# Patient Record
Sex: Female | Born: 1945 | Race: White | Hispanic: No | Marital: Single | Smoking: Never smoker
Health system: Southern US, Community
[De-identification: ages and names within clinical notes are randomized; demographics above are authoritative.]

## PROBLEM LIST (undated history)

## (undated) DIAGNOSIS — C50919 Malignant neoplasm of unspecified site of unspecified female breast: Secondary | ICD-10-CM

## (undated) DIAGNOSIS — I4891 Unspecified atrial fibrillation: Secondary | ICD-10-CM

## (undated) DIAGNOSIS — K6389 Other specified diseases of intestine: Secondary | ICD-10-CM

## (undated) DIAGNOSIS — I82409 Acute embolism and thrombosis of unspecified deep veins of unspecified lower extremity: Secondary | ICD-10-CM

## (undated) DIAGNOSIS — I10 Essential (primary) hypertension: Secondary | ICD-10-CM

## (undated) DIAGNOSIS — G8929 Other chronic pain: Secondary | ICD-10-CM

## (undated) DIAGNOSIS — E119 Type 2 diabetes mellitus without complications: Secondary | ICD-10-CM

## (undated) DIAGNOSIS — M1990 Unspecified osteoarthritis, unspecified site: Secondary | ICD-10-CM

## (undated) HISTORY — PX: LYMPHADENECTOMY: SHX5960

## (undated) HISTORY — PX: TONSILLECTOMY: SUR1361

## (undated) HISTORY — PX: BREAST LUMPECTOMY: SHX2

## (undated) HISTORY — PX: KIDNEY STONE SURGERY: SHX686

## (undated) HISTORY — PX: CHOLECYSTECTOMY: SHX55

---

## 2010-05-18 ENCOUNTER — Emergency Department (INDEPENDENT_AMBULATORY_CARE_PROVIDER_SITE_OTHER)
Admission: EM | Admit: 2010-05-18 | Discharge: 2010-05-18 | Payer: Self-pay | Source: Home / Self Care | Admitting: Emergency Medicine

## 2010-05-18 DIAGNOSIS — R109 Unspecified abdominal pain: Secondary | ICD-10-CM

## 2010-05-18 DIAGNOSIS — K7689 Other specified diseases of liver: Secondary | ICD-10-CM

## 2010-05-18 DIAGNOSIS — K5730 Diverticulosis of large intestine without perforation or abscess without bleeding: Secondary | ICD-10-CM

## 2010-05-18 LAB — DIFFERENTIAL
Basophils Absolute: 0 10*3/uL (ref 0.0–0.1)
Eosinophils Absolute: 0.1 10*3/uL (ref 0.0–0.7)
Eosinophils Relative: 2 % (ref 0–5)
Lymphocytes Relative: 24 % (ref 12–46)
Neutrophils Relative %: 68 % (ref 43–77)

## 2010-05-18 LAB — COMPREHENSIVE METABOLIC PANEL
ALT: 48 U/L — ABNORMAL HIGH (ref 0–35)
AST: 43 U/L — ABNORMAL HIGH (ref 0–37)
Albumin: 4 g/dL (ref 3.5–5.2)
Alkaline Phosphatase: 98 U/L (ref 39–117)
Potassium: 4.2 mEq/L (ref 3.5–5.1)
Sodium: 145 mEq/L (ref 135–145)
Total Protein: 7.7 g/dL (ref 6.0–8.3)

## 2010-05-18 LAB — CBC
Platelets: 275 10*3/uL (ref 150–400)
RBC: 4.25 MIL/uL (ref 3.87–5.11)
RDW: 12.5 % (ref 11.5–15.5)
WBC: 7.7 10*3/uL (ref 4.0–10.5)

## 2010-05-18 LAB — URINALYSIS, ROUTINE W REFLEX MICROSCOPIC
Bilirubin Urine: NEGATIVE
Hgb urine dipstick: NEGATIVE
Ketones, ur: NEGATIVE mg/dL
Urine Glucose, Fasting: NEGATIVE mg/dL
pH: 5.5 (ref 5.0–8.0)

## 2010-05-18 LAB — URINE MICROSCOPIC-ADD ON

## 2015-11-21 ENCOUNTER — Encounter (HOSPITAL_BASED_OUTPATIENT_CLINIC_OR_DEPARTMENT_OTHER): Payer: Self-pay | Admitting: *Deleted

## 2015-11-21 ENCOUNTER — Emergency Department (HOSPITAL_BASED_OUTPATIENT_CLINIC_OR_DEPARTMENT_OTHER)
Admission: EM | Admit: 2015-11-21 | Discharge: 2015-11-21 | Disposition: A | Discharge status: Home / Self Care | Payer: Medicare Other | Attending: Emergency Medicine | Admitting: Emergency Medicine

## 2015-11-21 DIAGNOSIS — I10 Essential (primary) hypertension: Secondary | ICD-10-CM | POA: Diagnosis not present

## 2015-11-21 DIAGNOSIS — I482 Chronic atrial fibrillation, unspecified: Secondary | ICD-10-CM

## 2015-11-21 DIAGNOSIS — E119 Type 2 diabetes mellitus without complications: Secondary | ICD-10-CM | POA: Insufficient documentation

## 2015-11-21 DIAGNOSIS — R109 Unspecified abdominal pain: Secondary | ICD-10-CM | POA: Insufficient documentation

## 2015-11-21 DIAGNOSIS — R112 Nausea with vomiting, unspecified: Secondary | ICD-10-CM | POA: Diagnosis present

## 2015-11-21 DIAGNOSIS — Z87891 Personal history of nicotine dependence: Secondary | ICD-10-CM | POA: Insufficient documentation

## 2015-11-21 HISTORY — DX: Malignant neoplasm of unspecified site of unspecified female breast: C50.919

## 2015-11-21 HISTORY — DX: Unspecified osteoarthritis, unspecified site: M19.90

## 2015-11-21 HISTORY — DX: Acute embolism and thrombosis of unspecified deep veins of unspecified lower extremity: I82.409

## 2015-11-21 HISTORY — DX: Essential (primary) hypertension: I10

## 2015-11-21 HISTORY — DX: Other chronic pain: G89.29

## 2015-11-21 HISTORY — DX: Unspecified atrial fibrillation: I48.91

## 2015-11-21 HISTORY — DX: Type 2 diabetes mellitus without complications: E11.9

## 2015-11-21 LAB — COMPREHENSIVE METABOLIC PANEL
ALBUMIN: 3.7 g/dL (ref 3.5–5.0)
ALK PHOS: 61 U/L (ref 38–126)
ALT: 18 U/L (ref 14–54)
AST: 15 U/L (ref 15–41)
Anion gap: 8 (ref 5–15)
BILIRUBIN TOTAL: 1.2 mg/dL (ref 0.3–1.2)
BUN: 16 mg/dL (ref 6–20)
CALCIUM: 9.2 mg/dL (ref 8.9–10.3)
CO2: 29 mmol/L (ref 22–32)
Chloride: 104 mmol/L (ref 101–111)
Creatinine, Ser: 0.97 mg/dL (ref 0.44–1.00)
GFR calc Af Amer: >60 mL/min (ref >60)
GFR calc non Af Amer: 58 mL/min — ABNORMAL LOW (ref >60)
GLUCOSE: 183 mg/dL — AB (ref 65–99)
Potassium: 3.9 mmol/L (ref 3.5–5.1)
Sodium: 141 mmol/L (ref 135–145)
TOTAL PROTEIN: 7.2 g/dL (ref 6.5–8.1)

## 2015-11-21 LAB — CBC WITH DIFFERENTIAL/PLATELET
BASOS ABS: 0 10*3/uL (ref 0.0–0.1)
BASOS PCT: 1 %
EOS PCT: 2 %
Eosinophils Absolute: 0.1 10*3/uL (ref 0.0–0.7)
HCT: 43 % (ref 36.0–46.0)
Hemoglobin: 14.3 g/dL (ref 12.0–15.0)
LYMPHS PCT: 18 %
Lymphs Abs: 1.2 10*3/uL (ref 0.7–4.0)
MCH: 30.7 pg (ref 26.0–34.0)
MCHC: 33.3 g/dL (ref 30.0–36.0)
MCV: 92.3 fL (ref 78.0–100.0)
MONO ABS: 0.4 10*3/uL (ref 0.1–1.0)
Monocytes Relative: 6 %
Neutro Abs: 4.7 10*3/uL (ref 1.7–7.7)
Neutrophils Relative %: 73 %
PLATELETS: 193 10*3/uL (ref 150–400)
RBC: 4.66 MIL/uL (ref 3.87–5.11)
RDW: 13.9 % (ref 11.5–15.5)
WBC: 6.4 10*3/uL (ref 4.0–10.5)

## 2015-11-21 LAB — PROTIME-INR
INR: 1.01
Prothrombin Time: 13.3 seconds (ref 11.4–15.2)

## 2015-11-21 MED ORDER — WARFARIN SODIUM 5 MG PO TABS: 5.0000 mg | ORAL_TABLET | Freq: Every day | ORAL | 0 refills | Status: DC

## 2015-11-21 NOTE — ED Triage Notes (Signed)
Pt c/o n/v x 2 hrs , recent injection  to left knee at pain management center earlier today

## 2015-11-21 NOTE — ED Provider Notes (Signed)
[place] DEPT [facility] Provider Note   CSN: [id number] Arrival date & time: 11/21/15  1925  First Provider Contact:   First MD Initiated Contact with Patient 11/21/15 2057    By signing my name below, I, Evelene Croon, attest that this documentation has been prepared under the direction and in the presence of Ripley Fraise, MD . Electronically Signed: Evelene Croon, Scribe. 11/21/2015. 9:13 PM.  History   Chief Complaint Chief Complaint  Patient presents with  . Emesis    The history is provided by the patient. No language interpreter was used.  Emesis   This is a new problem. The current episode started 3 to 5 hours ago. The problem occurs 2 to 4 times per day. The problem has been resolved. There has been no fever. Associated symptoms include abdominal pain. Pertinent negatives include no diarrhea and no fever.   HPI Comments:  Sheryl Aguilar is a 70 y.o. female who presents to the Emergency Department complaining of vomiting which began 1830 today, states she had 2 episodes. Pt  took 3 Percocet tabs ~1300; states she does not normally take that many at one time. Pt states she took the Percocet because she was worried about the pain she may feel after receiving injections in her left knee this afternoon at pain management clinic. Pt states she feels better at this time, however, notes associated abdominal soreness. She also notes mild abdominal pain prior to her first episode of vomiting. She denies fever, HA, CP, and SOB. Pt is currently supposed to be on coumadin but states she lost the bottle and hasn't had any in ~ 1 month.   [facility]-PCP   Past Medical History:  Diagnosis Date  . A-fib ([place])   . Arthritis   . Breast CA ([place])   . Chronic pain   . Diabetes mellitus without complication ([place])   . DVT (deep venous thrombosis) ([place])   . Hypertension     There are no active problems to display for this patient.   Past Surgical History:  Procedure Laterality Date  . BREAST  LUMPECTOMY    . LYMPHADENECTOMY    . TONSILLECTOMY      OB History    No data available       Home Medications    Prior to Admission medications   Not on File    Family History No family history on file.  Social History Social History  Substance Use Topics  . Smoking status: Former Research scientist (life sciences)  . Smokeless tobacco: Not on file  . Alcohol use No     Allergies   Other   Review of Systems Review of Systems  Constitutional: Negative for fever.  Gastrointestinal: Positive for abdominal pain and vomiting. Negative for diarrhea.  All other systems reviewed and are negative.  Physical Exam Updated Vital Signs BP 124/93 (BP Location: Right Arm)   Pulse 83   Temp 97.8 F (36.6 C) (Oral)   Resp 18   Ht 5\' 5"  (1.651 m)   Wt 235 lb (106.6 kg)   SpO2 96%   BMI 39.11 kg/m   Physical Exam CONSTITUTIONAL: Well developed/well nourished HEAD: Normocephalic/atraumatic EYES: EOMI/PERRL ENMT: Mucous membranes moist NECK: supple no meningeal signs SPINE/BACK:entire spine nontender CV: irregular rhythm; murmur noted  LUNGS: Lungs are clear to auscultation bilaterally, no apparent distress ABDOMEN: soft, nontender, no rebound or guarding, bowel sounds noted throughout abdomen, she is obese GU:no cva tenderness NEURO: Pt is awake/alert/appropriate, moves all extremitiesx4.  No facial droop.  EXTREMITIES: pulses normal/equal, full ROM; chronic edema noted,  erythema noted to right leg (chronic per patient) SKIN: warm, color normal PSYCH: no abnormalities of mood noted, alert and oriented to situation    ED Treatments / Results  DIAGNOSTIC STUDIES:  Oxygen Saturation is 96% on RA, normal by my interpretation.    COORDINATION OF CARE:  9:08 PM Discussed treatment plan with pt at bedside and pt agreed to plan.  Labs (all labs ordered are listed, but only abnormal results are displayed) Labs Reviewed  COMPREHENSIVE METABOLIC PANEL - Abnormal; Notable for the following:        Result Value   Glucose, Bld 183 (*)    GFR calc non Af Amer 58 (*)    All other components within normal limits  CBC WITH DIFFERENTIAL/PLATELET  PROTIME-INR    EKG  EKG Interpretation  Date/Time:  Wednesday November 21 2015 22:01:30 EDT Ventricular Rate:  83 PR Interval:    QRS Duration: 108 QT Interval:  447 QTC Calculation: 526 R Axis:   86 Text Interpretation:  Atrial fibrillation Borderline right axis deviation Borderline repolarization abnormality Prolonged QT interval Baseline wander in lead(s) II III aVL aVF No previous ECGs available Confirmed by Christy Gentles  MD, Elenore Rota ([id number]) on 11/21/2015 10:06:33 PM       Radiology No results found.  Procedures Procedures   Medications Ordered in ED Medications - No data to display   Initial Impression / Assessment and Plan / ED Course  I have reviewed the triage vital signs and the nursing notes.  Pertinent labs & imaging results that were available during my care of the patient were reviewed by me and considered in my medical decision making (see chart for details).  Clinical Course    This patients CHA2DS2-VASc Score and unadjusted Ischemic Stroke Rate (% per year) is equal to 7.2 % stroke rate/year from a score of 5  Above score calculated as 1 point each if present [CHF, HTN, DM, Vascular=MI/PAD/Aortic Plaque, Age if 65-74, or Female] Above score calculated as 2 points each if present [Age > 75, or Stroke/TIA/TE]    Pt improved Suspect this was related to overmedicating.  However, this does not represent toxic overdose She is well appearing/watching TV No abd pain complaints for now  As for her afib, she freely admits to not taking her coumadin INR subtherapeutic We agreed for me to write for an Rx for 10 days while she is awaiting recheck and expressed importance of have close f/u with coumadin clinic    Final Clinical Impressions(s) / ED Diagnoses   Final diagnoses:  Non-intractable vomiting with nausea,  vomiting of unspecified type  Chronic atrial fibrillation [facility])    New Prescriptions Discharge Medication List as of 11/21/2015 11:08 PM    START taking these medications   Details  warfarin (COUMADIN) 5 MG tablet Take 1 tablet (5 mg total) by mouth daily., Starting Wed 11/21/2015, Print       I personally performed the services described in this documentation, which was scribed in my presence. The recorded information has been reviewed and is accurate.        Ripley Fraise, MD 11/22/15 [phone]

## 2015-11-21 NOTE — ED Notes (Signed)
Dr. Wickline at BS.  

## 2015-11-21 NOTE — ED Notes (Signed)
Alert, NAD, calm, interactive, VSS, family at [facility], denies questions or needs, pending lab results.

## 2015-11-21 NOTE — ED Notes (Signed)
Pt here for recent nv. Denies pain or nv at this time. Reports recent L knee injection today at 1600. Later vomited x2 at [facility]. "feel better now". Also reports took 4 tablets of percocet 10/325 at 1300, "usually take 3, took 4 b/c I was expecting the knee injection to hurt a lot", OD on tylenol and not taking meds as prescribed discussed with pt, h/o CA with chemo, also reports can't find my coumadin, have not had it in days, last PT/INR months ago". Pt alert, NAD, calm, interactive, resps e/u, no dyspnea noted, denies complaints at this time. RLE minimally red and swollen compared to L, "usual for me". CMS/ROM intact.

## 2017-12-06 ENCOUNTER — Other Ambulatory Visit: Payer: Self-pay

## 2017-12-06 ENCOUNTER — Encounter (HOSPITAL_BASED_OUTPATIENT_CLINIC_OR_DEPARTMENT_OTHER): Payer: Self-pay | Admitting: Emergency Medicine

## 2017-12-06 DIAGNOSIS — Z853 Personal history of malignant neoplasm of breast: Secondary | ICD-10-CM | POA: Insufficient documentation

## 2017-12-06 DIAGNOSIS — Z7984 Long term (current) use of oral hypoglycemic drugs: Secondary | ICD-10-CM | POA: Insufficient documentation

## 2017-12-06 DIAGNOSIS — E119 Type 2 diabetes mellitus without complications: Secondary | ICD-10-CM | POA: Insufficient documentation

## 2017-12-06 DIAGNOSIS — L03115 Cellulitis of right lower limb: Secondary | ICD-10-CM | POA: Diagnosis not present

## 2017-12-06 DIAGNOSIS — I10 Essential (primary) hypertension: Secondary | ICD-10-CM | POA: Insufficient documentation

## 2017-12-06 DIAGNOSIS — Z7901 Long term (current) use of anticoagulants: Secondary | ICD-10-CM | POA: Diagnosis not present

## 2017-12-06 DIAGNOSIS — M79661 Pain in right lower leg: Secondary | ICD-10-CM | POA: Diagnosis present

## 2017-12-06 DIAGNOSIS — Z87891 Personal history of nicotine dependence: Secondary | ICD-10-CM | POA: Insufficient documentation

## 2017-12-06 NOTE — ED Triage Notes (Signed)
C/o R foot pain up to mid shin. Skin darkened in foot at baseline, but pt states it is darker than usual now. Pt states this has been going on x2 weeks. Pt states she cannot bear weight, denies injury.

## 2017-12-07 ENCOUNTER — Emergency Department (HOSPITAL_BASED_OUTPATIENT_CLINIC_OR_DEPARTMENT_OTHER): Payer: Medicare Other

## 2017-12-07 ENCOUNTER — Emergency Department (HOSPITAL_BASED_OUTPATIENT_CLINIC_OR_DEPARTMENT_OTHER)
Admission: EM | Admit: 2017-12-07 | Discharge: 2017-12-07 | Disposition: A | Discharge status: Home / Self Care | Payer: Medicare Other | Attending: Emergency Medicine | Admitting: Emergency Medicine

## 2017-12-07 DIAGNOSIS — L03115 Cellulitis of right lower limb: Secondary | ICD-10-CM

## 2017-12-07 LAB — CBC WITH DIFFERENTIAL/PLATELET
Basophils Absolute: 0 10*3/uL (ref 0.0–0.1)
Basophils Relative: 0 %
Eosinophils Absolute: 0.2 10*3/uL (ref 0.0–0.7)
Eosinophils Relative: 2 %
HEMATOCRIT: 39.7 % (ref 36.0–46.0)
HEMOGLOBIN: 13.6 g/dL (ref 12.0–15.0)
LYMPHS PCT: 19 %
Lymphs Abs: 1.9 10*3/uL (ref 0.7–4.0)
MCH: 32.2 pg (ref 26.0–34.0)
MCHC: 34.3 g/dL (ref 30.0–36.0)
MCV: 93.9 fL (ref 78.0–100.0)
MONOS PCT: 6 %
Monocytes Absolute: 0.6 10*3/uL (ref 0.1–1.0)
NEUTROS PCT: 73 %
Neutro Abs: 7.4 10*3/uL (ref 1.7–7.7)
Platelets: 210 10*3/uL (ref 150–400)
RBC: 4.23 MIL/uL (ref 3.87–5.11)
RDW: 13.3 % (ref 11.5–15.5)
WBC: 10 10*3/uL (ref 4.0–10.5)

## 2017-12-07 LAB — BASIC METABOLIC PANEL
Anion gap: 9 (ref 5–15)
BUN: 18 mg/dL (ref 8–23)
CHLORIDE: 99 mmol/L (ref 98–111)
CO2: 32 mmol/L (ref 22–32)
CREATININE: 1.09 mg/dL — AB (ref 0.44–1.00)
Calcium: 8.8 mg/dL — ABNORMAL LOW (ref 8.9–10.3)
GFR calc non Af Amer: 49 mL/min — ABNORMAL LOW (ref >60)
GFR, EST AFRICAN AMERICAN: 57 mL/min — AB (ref >60)
Glucose, Bld: 223 mg/dL — ABNORMAL HIGH (ref 70–99)
Potassium: 3.4 mmol/L — ABNORMAL LOW (ref 3.5–5.1)
Sodium: 140 mmol/L (ref 135–145)

## 2017-12-07 LAB — I-STAT CG4 LACTIC ACID, ED: Lactic Acid, Venous: 1.38 mmol/L (ref 0.5–1.9)

## 2017-12-07 LAB — TROPONIN I

## 2017-12-07 MED ORDER — DOXYCYCLINE HYCLATE 100 MG PO CAPS: 100.0000 mg | ORAL_CAPSULE | Freq: Two times a day (BID) | ORAL | 0 refills | Status: DC

## 2017-12-07 MED ORDER — HYDROCODONE-ACETAMINOPHEN 5-325 MG PO TABS
1.0000 | ORAL_TABLET | Freq: Once | ORAL | Status: AC
  Administered 2017-12-07: 1 via ORAL
  Filled 2017-12-07: qty 1

## 2017-12-07 MED ORDER — IOPAMIDOL (ISOVUE-370) INJECTION 76%
100.0000 mL | Freq: Once | INTRAVENOUS | Status: AC | PRN
  Administered 2017-12-07: 100 mL via INTRAVENOUS

## 2017-12-07 MED ORDER — DOXYCYCLINE HYCLATE 100 MG PO TABS
100.0000 mg | ORAL_TABLET | Freq: Once | ORAL | Status: AC
  Administered 2017-12-07: 100 mg via ORAL
  Filled 2017-12-07: qty 1

## 2017-12-07 NOTE — ED Provider Notes (Signed)
[place] EMERGENCY DEPARTMENT Provider Note   CSN: [id number] Arrival date & time: 12/06/17  2316     History   Chief Complaint Chief Complaint  Patient presents with  . Foot Pain    HPI Sheryl Aguilar is a 72 y.o. female.  72 year old female with past medical history including atrial fibrillation on Eliquis, breast cancer, DVT, hypertension, type 2 diabetes mellitus who presents with right lower leg pain.  The patient states that she has a long-standing history of right lower leg swelling and skin darkening.  She states her doctor has seen her for this before and has told her it is due to poor circulation.  She reports some neuropathy in her feet that is chronic but states that she does not usually have pain.  Over the past 2 weeks, she has had progressively worsening pain that started on the lateral side of her right foot, then involved her entire foot, and now involves her lower leg up to her mid shin.  She denies any trauma or fall.  Pain is more severe when trying to bear weight on her leg.  She denies any fevers or vomiting.  She is compliant with her medications.  She notes that she has had intermittent L sided non-radiating sharp chest pain occasionally for a long time. It occurs randomly, is not associated w/ exertion, and lasts seconds to a few minutes. She denies any CP currently. She has chronic SOB with exertion that has not changed recently. She denies any current SOB.  The history is provided by the patient.  Foot Pain     Past Medical History:  Diagnosis Date  . A-fib ([place])   . Arthritis   . Breast CA ([place])   . Chronic pain   . Diabetes mellitus without complication ([place])   . DVT (deep venous thrombosis) ([place])   . Hypertension     There are no active problems to display for this patient.   Past Surgical History:  Procedure Laterality Date  . BREAST LUMPECTOMY    . CHOLECYSTECTOMY    . LYMPHADENECTOMY    . TONSILLECTOMY       OB History     None      Home Medications    Prior to Admission medications   Medication Sig Start Date End Date Taking? Authorizing Provider  Apixaban (ELIQUIS PO) Take by mouth.   Yes [provider]  carvedilol (COREG CR) 10 MG 24 hr capsule Take 10 mg by mouth daily.   Yes [provider]  DULoxetine (CYMBALTA) 60 MG capsule Take 60 mg by mouth daily.   Yes [provider]  furosemide (LASIX) 20 MG tablet Take 10 mg by mouth daily.   Yes [provider]  metFORMIN (GLUCOPHAGE) 1000 MG tablet Take 1,000 mg by mouth 2 (two) times daily with a meal.   Yes [provider]  oxyCODONE-acetaminophen (PERCOCET) 7.5-325 MG tablet Take 1 tablet by mouth every 4 (four) hours as needed for severe pain.   Yes [provider]  doxycycline (VIBRAMYCIN) 100 MG capsule Take 1 capsule (100 mg total) by mouth 2 (two) times daily. 12/07/17   , Wenda Overland, MD    Family History No family history on file.  Social History Social History   Tobacco Use  . Smoking status: Former Research scientist (life sciences)  . Smokeless tobacco: Never Used  Substance Use Topics  . Alcohol use: No  . Drug use: No     Allergies   Other  Review of Systems Review of Systems All other systems reviewed and are negative except that which was mentioned in HPI   Physical Exam Updated Vital Signs BP 132/69   Pulse 86   Temp 98.4 F (36.9 C) (Oral)   Resp (!) 26   Ht 5\' 5"  (1.651 m)   Wt (!) 137.9 kg   SpO2 95%   BMI 50.59 kg/m   Physical Exam  Constitutional: She is oriented to person, place, and time. She appears well-developed and well-nourished. No distress.  HENT:  Head: Normocephalic and atraumatic.  Moist mucous membranes  Eyes: Conjunctivae are normal.  Neck: Neck supple.  Cardiovascular: Normal rate, regular rhythm and normal heart sounds.  No murmur heard. Pulmonary/Chest: Effort normal and breath sounds normal.  Abdominal: Soft. Bowel sounds are normal. She  exhibits no distension. There is no tenderness.  Musculoskeletal: She exhibits edema.  R leg significantly larger than left with skin darkening of foot, erythema of anterior lower leg w/ warmth compared to L; small callus on plantar surface R great toe, no drainage  Neurological: She is alert and oriented to person, place, and time.  Fluent speech  Skin: Skin is warm and dry. There is erythema.  Psychiatric: She has a normal mood and affect. Judgment normal.  Nursing note and vitals reviewed.    ED Treatments / Results  Labs (all labs ordered are listed, but only abnormal results are displayed) Labs Reviewed  BASIC METABOLIC PANEL - Abnormal; Notable for the following components:      Result Value   Potassium 3.4 (*)    Glucose, Bld 223 (*)    Creatinine, Ser 1.09 (*)    Calcium 8.8 (*)    GFR calc non Af Amer 49 (*)    GFR calc Af Amer 57 (*)    All other components within normal limits  CBC WITH DIFFERENTIAL/PLATELET  TROPONIN I  I-STAT CG4 LACTIC ACID, ED  I-STAT CG4 LACTIC ACID, ED    EKG EKG Interpretation  Date/Time:  Monday December 07 2017 03:34:56 EDT Ventricular Rate:  86 PR Interval:    QRS Duration: 92 QT Interval:  465 QTC Calculation: 557 R Axis:   81 Text Interpretation:  Atrial fibrillation Borderline right axis deviation Borderline repolarization abnormality Prolonged QT interval similar to previous Confirmed by Theotis Burrow [phone]) on 12/07/2017 3:59:15 AM   Radiology Dg Tibia/fibula Right  Result Date: 12/07/2017 CLINICAL DATA:  Right leg pain with swelling to the foot EXAM: RIGHT TIBIA AND FIBULA - 2 VIEW COMPARISON:  08/19/2016 FINDINGS: No fracture or malalignment. Marked arthritis medial and patellofemoral compartments. Soft tissue edema without emphysema. No periostitis or bony destructive changes. IMPRESSION: No acute osseous abnormality Electronically Signed   By: Donavan Foil M.D.   On: 12/07/2017 03:27   Ct Angio Low Extrem Right W &/or Wo  Contrast  Result Date: 12/07/2017 CLINICAL DATA:  Right foot pain up to the mid shin. Darkened skin of the foot. Symptoms for 2 weeks. Unable to bear weight. Previous history of deep venous thrombosis, diabetes, hypertension. EXAM: CT ANGIOGRAPHY OF THE right lowerEXTREMITY TECHNIQUE: Multidetector CT imaging of the right lowerwas performed using the standard protocol during bolus administration of intravenous contrast. Multiplanar CT image reconstructions and MIPs were obtained to evaluate the vascular anatomy. CONTRAST:  186mL ISOVUE-370 IOPAMIDOL (ISOVUE-370) INJECTION 76% COMPARISON:  None. FINDINGS: Vascular: The right external iliac artery, right common femoral artery, right superficial and deep femoral arteries, popliteal artery, and tibial trunk appear patent. Three-vessel runoff  is demonstrated to the right ankle. No significant focal stenosis or occlusion. Soft tissues: Diverticulosis of the sigmoid colon without inflammatory changes. Bladder wall is not thickened. No acute infiltrative changes or mass demonstrated in the visualized musculature. Mild edema in the subcutaneous fatty tissues of the lower leg. Small popliteal cyst. Bones: Degenerative changes in the right hip and right knee. No destructive or expansile bone lesions. No acute displaced fractures are demonstrated. Review of the MIP images confirms the above findings. IMPRESSION: No evidence of significant vascular stenosis or occlusion in the right lower extremity. Three vessel runoff to the right ankle. Electronically Signed   By: Lucienne Capers M.D.   On: 12/07/2017 05:08   Dg Foot Complete Right  Result Date: 12/07/2017 CLINICAL DATA:  Foot swelling and pain EXAM: RIGHT FOOT COMPLETE - 3+ VIEW COMPARISON:  None. FINDINGS: No fracture or malalignment. Small plantar calcaneal spur. Mild degenerative change at the first MTP joint. IMPRESSION: No acute osseous abnormality. Electronically Signed   By: Donavan Foil M.D.   On: 12/07/2017  03:28    Procedures Procedures (including critical care time)  Medications Ordered in ED Medications  doxycycline (VIBRA-TABS) tablet 100 mg (has no administration in time range)  HYDROcodone-acetaminophen (NORCO/VICODIN) 5-325 MG per tablet 1 tablet (1 tablet Oral Given 12/07/17 0356)  iopamidol (ISOVUE-370) 76 % injection 100 mL (100 mLs Intravenous Contrast Given 12/07/17 0356)  HYDROcodone-acetaminophen (NORCO/VICODIN) 5-325 MG per tablet 1 tablet (1 tablet Oral Given 12/07/17 0356)     Initial Impression / Assessment and Plan / ED Course  I have reviewed the triage vital signs and the nursing notes.  Pertinent labs & imaging results that were available during my care of the patient were reviewed by me and considered in my medical decision making (see chart for details).    Pt uncomfortable but non-toxic on exam, afebrile w/ reassuring VS. R leg significantly more swollen than L which she states is chronic, however today it is also warm and erythematous compared to L. Lactate and CBC normal, BG 223 with no evidence of DKA. Trop normal and EKG without ischemic changes. The pain that she describes over L breast is random, has been intermittently present for a long time, and is very atypical for ACS. PE very unlikely as patient is compliant with Eliquis.  XR of lower leg and foot negative. Obtained CTA to evaluate for PVD. CTA shows patent vessels and no acute musculoskeletal findings. Although patient states her current skin changes and swelling are chronic, the warmth and redness suggests cellulitis. Given her pain, recommended antibiotic course. Gave doxycycline here w/ Rx and instructions to f/u with PCP in a few days for reassessment. Discussed supportive measures including elevation and compression stockings.  Extensively reviewed return precautions including any worsening symptoms or new symptoms such as fever.  Also reviewed the importance of checking feet daily given her known diabetes  and diabetic neuropathy.  She and family voiced understanding. Final Clinical Impressions(s) / ED Diagnoses   Final diagnoses:  Cellulitis of right lower extremity    ED Discharge Orders         Ordered    doxycycline (VIBRAMYCIN) 100 MG capsule  2 times daily     12/07/17 0536           , Wenda Overland, MD 12/07/17 [phone]

## 2017-12-07 NOTE — Discharge Instructions (Signed)
Start taking antibiotic for cellulitis (skin infection) of your leg. Keep your leg elevated with pillows whenever you are sitting or laying down. Wear compression stockings. Check your feet daily and monitor for any foot wounds that look infected. See primary care doctor later this week to make sure your infection is getting better. Return to ER if your symptoms worsen or your start having fever or drainage.

## 2017-12-07 NOTE — ED Notes (Signed)
No changes, to CT, alert, NAD, calm, repositioned, family at [facility]. Pain med given.

## 2017-12-07 NOTE — ED Notes (Signed)
Pt not in room, in xray.

## 2017-12-07 NOTE — ED Notes (Signed)
EDP back at BS.  

## 2017-12-07 NOTE — ED Notes (Signed)
EDP into room, at [facility]. Pt/family updated with results and plan.

## 2017-12-07 NOTE — ED Notes (Signed)
Dr. Rex Kras into room, at [facility]. Pt remains alert, NAD, calm, interactive, resps e/u, no dyspnea.

## 2017-12-07 NOTE — ED Notes (Addendum)
Alert, NAD, calm, interactive, resps e/u, speaking in clear complete sentences, no dyspnea noted, skin W&D, VSS, reports h/o arthritis, c/o related L knee pain, also RLE heat, swelling and redness, (denies: CP, new sob, fever, nausea, dizziness or visual changes), BIB family. BLE dry, flaky, scaly.

## 2019-02-03 ENCOUNTER — Other Ambulatory Visit: Payer: Self-pay

## 2019-02-03 ENCOUNTER — Emergency Department (HOSPITAL_BASED_OUTPATIENT_CLINIC_OR_DEPARTMENT_OTHER): Payer: Medicare Other

## 2019-02-03 ENCOUNTER — Encounter (HOSPITAL_BASED_OUTPATIENT_CLINIC_OR_DEPARTMENT_OTHER): Payer: Self-pay | Admitting: *Deleted

## 2019-02-03 ENCOUNTER — Emergency Department (HOSPITAL_BASED_OUTPATIENT_CLINIC_OR_DEPARTMENT_OTHER)
Admission: EM | Admit: 2019-02-03 | Discharge: 2019-02-04 | Disposition: A | Discharge status: Home / Self Care | Payer: Medicare Other | Attending: Emergency Medicine | Admitting: Emergency Medicine

## 2019-02-03 DIAGNOSIS — S52125A Nondisplaced fracture of head of left radius, initial encounter for closed fracture: Secondary | ICD-10-CM

## 2019-02-03 DIAGNOSIS — Z79899 Other long term (current) drug therapy: Secondary | ICD-10-CM | POA: Diagnosis not present

## 2019-02-03 DIAGNOSIS — I10 Essential (primary) hypertension: Secondary | ICD-10-CM | POA: Diagnosis not present

## 2019-02-03 DIAGNOSIS — Z87891 Personal history of nicotine dependence: Secondary | ICD-10-CM | POA: Diagnosis not present

## 2019-02-03 DIAGNOSIS — Z7901 Long term (current) use of anticoagulants: Secondary | ICD-10-CM | POA: Diagnosis not present

## 2019-02-03 DIAGNOSIS — Y9389 Activity, other specified: Secondary | ICD-10-CM | POA: Diagnosis not present

## 2019-02-03 DIAGNOSIS — Y9289 Other specified places as the place of occurrence of the external cause: Secondary | ICD-10-CM | POA: Diagnosis not present

## 2019-02-03 DIAGNOSIS — W19XXXA Unspecified fall, initial encounter: Secondary | ICD-10-CM | POA: Diagnosis not present

## 2019-02-03 DIAGNOSIS — Y999 Unspecified external cause status: Secondary | ICD-10-CM | POA: Insufficient documentation

## 2019-02-03 DIAGNOSIS — M5489 Other dorsalgia: Secondary | ICD-10-CM | POA: Diagnosis present

## 2019-02-03 DIAGNOSIS — M546 Pain in thoracic spine: Secondary | ICD-10-CM | POA: Insufficient documentation

## 2019-02-03 DIAGNOSIS — E119 Type 2 diabetes mellitus without complications: Secondary | ICD-10-CM | POA: Diagnosis not present

## 2019-02-03 NOTE — ED Provider Notes (Signed)
[facility] EMERGENCY DEPARTMENT Provider Note   CSN: [id number] Arrival date & time: 02/03/19  1843     History   Chief Complaint Chief Complaint  Patient presents with  . Back Pain    HPI Sheryl Aguilar is a 73 y.o. female.     Patient presented to the emergency department by POV.  Patient woke with pain in the right side of her neck shoulder and back.  Pain with movement.  Patient also with left elbow pain following a fall with swelling.  Patient afebrile on presentation room air sats 95%.  Patient apparently has had frequent falls here.  She is here with her daughter.  Patient last seen in the emergency department in August 2019.  Past medical history significant for hypertension breast cancer diabetes without complications history of chronic pain A. fib and deep vein thrombosis.  Patient is on Eliquis.     Past Medical History:  Diagnosis Date  . A-fib ([place])   . Arthritis   . Breast CA ([place])   . Chronic pain   . Diabetes mellitus without complication ([place])   . DVT (deep venous thrombosis) ([place])   . Hypertension     There are no active problems to display for this patient.   Past Surgical History:  Procedure Laterality Date  . BREAST LUMPECTOMY    . CHOLECYSTECTOMY    . LYMPHADENECTOMY    . TONSILLECTOMY       OB History   No obstetric history on file.      Home Medications    Prior to Admission medications   Medication Sig Start Date End Date Taking? Authorizing Provider  Apixaban (ELIQUIS PO) Take by mouth.    [provider]  carvedilol (COREG CR) 10 MG 24 hr capsule Take 10 mg by mouth daily.    [provider]  doxycycline (VIBRAMYCIN) 100 MG capsule Take 1 capsule (100 mg total) by mouth 2 (two) times daily. 12/07/17   Little, Wenda Overland, MD  DULoxetine (CYMBALTA) 60 MG capsule Take 60 mg by mouth daily.    [provider]  furosemide (LASIX) 20 MG tablet Take 10 mg by mouth daily.    [provider]   metFORMIN (GLUCOPHAGE) 1000 MG tablet Take 1,000 mg by mouth 2 (two) times daily with a meal.    [provider]  oxyCODONE-acetaminophen (PERCOCET) 7.5-325 MG tablet Take 1 tablet by mouth every 4 (four) hours as needed for severe pain.    [provider]    Family History No family history on file.  Social History Social History   Tobacco Use  . Smoking status: Former Research scientist (life sciences)  . Smokeless tobacco: Never Used  Substance Use Topics  . Alcohol use: No  . Drug use: No     Allergies   Other   Review of Systems Review of Systems  Constitutional: Negative for chills and fever.  HENT: Negative for congestion, rhinorrhea and sore throat.   Eyes: Negative for visual disturbance.  Respiratory: Negative for cough and shortness of breath.   Cardiovascular: Negative for chest pain and leg swelling.  Gastrointestinal: Negative for abdominal pain, diarrhea, nausea and vomiting.  Genitourinary: Negative for dysuria.  Musculoskeletal: Positive for back pain, joint swelling and neck pain.  Skin: Negative for rash.  Neurological: Negative for dizziness, syncope, speech difficulty, weakness, light-headedness, numbness and headaches.  Hematological: Does not bruise/bleed easily.  Psychiatric/Behavioral: Negative for confusion.     Physical Exam Updated Vital Signs BP 113/66 (BP  Location: Right Arm)   Pulse 94   Temp 98.5 F (36.9 C) (Oral)   Resp 17   Ht 1.626 m (5\' 4" )   Wt (!) 138.3 kg   SpO2 98%   BMI 52.35 kg/m   Physical Exam Vitals signs and nursing note reviewed.  Constitutional:      General: She is not in acute distress.    Appearance: Normal appearance. She is well-developed.  HENT:     Head: Normocephalic and atraumatic.  Eyes:     Extraocular Movements: Extraocular movements intact.     Conjunctiva/sclera: Conjunctivae normal.     Pupils: Pupils are equal, round, and reactive to light.  Neck:     Musculoskeletal: Normal range of motion and  neck supple. No neck rigidity or muscular tenderness.  Cardiovascular:     Rate and Rhythm: Normal rate and regular rhythm.     Heart sounds: No murmur.  Pulmonary:     Effort: Pulmonary effort is normal. No respiratory distress.     Breath sounds: Normal breath sounds.     Comments: Some tenderness along the lateral aspect of the right chest wall.  And towards the thoracic spine area. Chest:     Chest wall: Tenderness present.  Abdominal:     Palpations: Abdomen is soft.     Tenderness: There is no abdominal tenderness.  Musculoskeletal: Normal range of motion.     Comments: Left elbow with area of swelling around it.  Discomfort with range of motion.  Right upper extremity without any tenderness but discomfort with range of motion.  Radial pulses bilaterally 1-2+.  Good cap refill.  Sensation distally intact.  Skin:    General: Skin is warm and dry.     Capillary Refill: Capillary refill takes less than 2 seconds.  Neurological:     General: No focal deficit present.     Mental Status: She is alert and oriented to person, place, and time.     Cranial Nerves: No cranial nerve deficit.     Sensory: No sensory deficit.     Motor: No weakness.      ED Treatments / Results  Labs (all labs ordered are listed, but only abnormal results are displayed) Labs Reviewed - No data to display  EKG None  Radiology Dg Ribs Unilateral W/chest Right  Result Date: 02/03/2019 CLINICAL DATA:  Fall EXAM: RIGHT RIBS AND CHEST - 3+ VIEW COMPARISON:  03/09/2017 FINDINGS: Single-view chest demonstrates no acute airspace disease or effusion. Heart size upper normal. No pneumothorax. Right rib series demonstrates no acute displaced right rib fracture. Clips in the right upper quadrant. Coils in the epigastric region. IMPRESSION: Negative. Electronically Signed   By: Donavan Foil M.D.   On: 02/03/2019 21:52   Dg Shoulder Right  Result Date: 02/03/2019 CLINICAL DATA:  Multiple fall with right  shoulder pain EXAM: RIGHT SHOULDER - 2+ VIEW COMPARISON:  None. FINDINGS: Moderate AC joint degenerative change. No fracture or malalignment. Right lung apex is clear IMPRESSION: No acute osseous abnormality Electronically Signed   By: Donavan Foil M.D.   On: 02/03/2019 21:50   Dg Elbow Complete Left  Result Date: 02/03/2019 CLINICAL DATA:  Question a radial head fracture EXAM: LEFT ELBOW - COMPLETE 3+ VIEW COMPARISON:  Same day humeral radiograph FINDINGS: Mildly impacted fracture of the radial neck with a large elbow joint effusion. Radial head remains normally located. Question a small lucency along the capitellum which could reflect either a nondisplaced fracture or bony contusive  injury. Mild degenerative spurring is noted at the coronoid process. Mild circumferential swelling is present. IMPRESSION: 1. Mildly impacted fracture of the radial neck with a large elbow joint effusion. 2. Question a small lucency along the capitellum which could reflect either a nondisplaced fracture or bony contusive injury. Electronically Signed   By: Lovena Le M.D.   On: 02/03/2019 23:11   Dg Humerus Left  Result Date: 02/03/2019 CLINICAL DATA:  73 year old female status post fall with pain. EXAM: LEFT HUMERUS - 2+ VIEW COMPARISON:  None. FINDINGS: Bone mineralization is within normal limits. Grossly normal alignment at the left shoulder. Proximal left humerus appears intact. Left axillary surgical clips incidentally noted. Visible left ribs appear intact. Intact left humeral shaft, and the distal humerus appears intact but images which include the elbow suggest a comminuted acute fracture of the left proximal radius, at the junction of the head and neck. IMPRESSION: 1. Suspected acute fracture of the left proximal radius at the elbow. Recommend dedicated Left Elbow Series. 2. Left humerus appears intact. Electronically Signed   By: Genevie Ann M.D.   On: 02/03/2019 21:49    Procedures Procedures (including critical  care time)  Medications Ordered in ED Medications - No data to display   Initial Impression / Assessment and Plan / ED Course  I have reviewed the triage vital signs and the nursing notes.  Pertinent labs & imaging results that were available during my care of the patient were reviewed by me and considered in my medical decision making (see chart for details).       Patient's x-ray chest with right sided ribs without any acute findings.  X-ray of the right upper extremity without any acute findings.  X-ray of the left elbow showed a suspected a acute fracture left proximal radius at the elbow.  Dedicated x-rays of the right elbow showed a mildly impacted right radial head fracture.  Patient placed in a sling.  And referred to Ortho's and/or sports medicine.  No other acute findings.   Final Clinical Impressions(s) / ED Diagnoses   Final diagnoses:  Acute right-sided thoracic back pain  Closed nondisplaced fracture of head of left radius, initial encounter    ED Discharge Orders    None       Fredia Sorrow, MD 02/15/19 [phone]

## 2019-02-03 NOTE — ED Triage Notes (Signed)
She woke with pain in the right side of her neck, shoulder and back. Pain with movement.

## 2019-02-03 NOTE — ED Notes (Signed)
Escorted pt to treatment room via wheelchair, instructed to put on gown. Awaiting provider.

## 2019-02-03 NOTE — Discharge Instructions (Addendum)
Continue take your Percocet.  Make an appointment to follow-up with your orthopedic doctor.  Wear the sling at all times except for bathing.

## 2020-06-19 HISTORY — PX: OTHER SURGICAL HISTORY: SHX169

## 2020-08-28 ENCOUNTER — Encounter: Payer: Self-pay | Admitting: Endocrinology

## 2021-01-22 ENCOUNTER — Observation Stay (HOSPITAL_COMMUNITY)
Admission: EM | Admit: 2021-01-22 | Discharge: 2021-01-23 | Disposition: A | Discharge status: Home / Self Care | Payer: Medicare Other | Attending: Internal Medicine | Admitting: Internal Medicine

## 2021-01-22 ENCOUNTER — Emergency Department (HOSPITAL_COMMUNITY): Payer: Medicare Other

## 2021-01-22 ENCOUNTER — Other Ambulatory Visit: Payer: Self-pay

## 2021-01-22 DIAGNOSIS — N179 Acute kidney failure, unspecified: Secondary | ICD-10-CM | POA: Diagnosis not present

## 2021-01-22 DIAGNOSIS — I4891 Unspecified atrial fibrillation: Secondary | ICD-10-CM | POA: Diagnosis not present

## 2021-01-22 DIAGNOSIS — R509 Fever, unspecified: Secondary | ICD-10-CM | POA: Diagnosis present

## 2021-01-22 DIAGNOSIS — N390 Urinary tract infection, site not specified: Secondary | ICD-10-CM | POA: Diagnosis present

## 2021-01-22 DIAGNOSIS — R519 Headache, unspecified: Secondary | ICD-10-CM | POA: Diagnosis not present

## 2021-01-22 DIAGNOSIS — N3000 Acute cystitis without hematuria: Secondary | ICD-10-CM | POA: Diagnosis not present

## 2021-01-22 DIAGNOSIS — M25569 Pain in unspecified knee: Secondary | ICD-10-CM

## 2021-01-22 DIAGNOSIS — E119 Type 2 diabetes mellitus without complications: Secondary | ICD-10-CM | POA: Insufficient documentation

## 2021-01-22 DIAGNOSIS — U071 COVID-19: Secondary | ICD-10-CM | POA: Diagnosis not present

## 2021-01-22 DIAGNOSIS — J9611 Chronic respiratory failure with hypoxia: Secondary | ICD-10-CM | POA: Diagnosis present

## 2021-01-22 DIAGNOSIS — I10 Essential (primary) hypertension: Secondary | ICD-10-CM | POA: Insufficient documentation

## 2021-01-22 DIAGNOSIS — W19XXXA Unspecified fall, initial encounter: Secondary | ICD-10-CM | POA: Insufficient documentation

## 2021-01-22 DIAGNOSIS — G8929 Other chronic pain: Secondary | ICD-10-CM | POA: Diagnosis present

## 2021-01-22 DIAGNOSIS — Z853 Personal history of malignant neoplasm of breast: Secondary | ICD-10-CM | POA: Diagnosis not present

## 2021-01-22 HISTORY — DX: Other specified diseases of intestine: K63.89

## 2021-01-22 MED ORDER — SODIUM CHLORIDE 0.9 % IV SOLN
2.0000 g | INTRAVENOUS | Status: DC
  Administered 2021-01-23: 2 g via INTRAVENOUS
  Filled 2021-01-22: qty 20

## 2021-01-22 MED ORDER — LACTATED RINGERS IV BOLUS
1000.0000 mL | Freq: Once | INTRAVENOUS | Status: AC
  Administered 2021-01-23: 1000 mL via INTRAVENOUS

## 2021-01-22 MED ORDER — FENTANYL CITRATE PF 50 MCG/ML IJ SOSY
25.0000 ug | PREFILLED_SYRINGE | Freq: Once | INTRAMUSCULAR | Status: AC
Start: 2021-01-23 — End: 2021-01-23
  Administered 2021-01-23: 25 ug via INTRAVENOUS
  Filled 2021-01-22: qty 1

## 2021-01-22 MED ORDER — ACETAMINOPHEN 500 MG PO TABS
1000.0000 mg | ORAL_TABLET | Freq: Once | ORAL | Status: AC
  Administered 2021-01-23: 1000 mg via ORAL
  Filled 2021-01-22: qty 2

## 2021-01-22 NOTE — ED Provider Notes (Signed)
[facility] Emergency Department Provider Note MRN:  [id number]  Arrival date & time: 01/23/21     Chief Complaint   Fall   History of Present Illness   Sheryl Aguilar is a 75 y.o. year-old female with a history of A. fib, chronic pain, diabetes presenting to the ED with chief complaint of fall.  Patient was standing up from the commode and lost her balance and fell.  She explains that she has a long history of arthritis and peripheral neuropathy in her legs and that caused her to stumble.  She fell onto her bottom and is endorsing some pain in that location.  She is also endorsing chronic pain to bilateral knees, unchanged from the fall.  Unsure if she hit her head.  Takes blood thinners.  No neck or back pain, no chest pain or shortness of breath, no abdominal pain.  No nausea vomiting or diarrhea recently.  No rash.  Has been feeling a chill over the past day or 2.  Febrile on arrival.  Pain is moderate to severe, constant, worse with motion or palpation.  Review of Systems  A complete 10 system review of systems was obtained and all systems are negative except as noted in the HPI and PMH.   Patient's Health History    Past Medical History:  Diagnosis Date   A-fib ([place])    Arthritis    Breast CA ([place])    Chronic pain    Diabetes mellitus without complication ([place])    DVT (deep venous thrombosis) ([place])    Hypertension     Past Surgical History:  Procedure Laterality Date   BREAST LUMPECTOMY     CHOLECYSTECTOMY     LYMPHADENECTOMY     TONSILLECTOMY      No family history on file.  Social History   Socioeconomic History   Marital status: Single    Spouse name: Not on file   Number of children: Not on file   Years of education: Not on file   Highest education level: Not on file  Occupational History   Not on file  Tobacco Use   Smoking status: Former   Smokeless tobacco: Never  Vaping Use   Vaping Use: Never used  Substance and Sexual  Activity   Alcohol use: No   Drug use: No   Sexual activity: Not on file  Other Topics Concern   Not on file  Social History Narrative   Not on file   Social Determinants of Health   Financial Resource Strain: Not on file  Food Insecurity: Not on file  Transportation Needs: Not on file  Physical Activity: Not on file  Stress: Not on file  Social Connections: Not on file  Intimate Partner Violence: Not on file     Physical Exam   Vitals:   01/23/21 0113 01/23/21 0130  BP: (!) 143/80 (!) 148/84  Pulse: (!) 102 (!) 106  Resp: (!) 24 (!) 24  Temp: 100.3 F (37.9 C)   SpO2: 98% 96%    CONSTITUTIONAL: Well-appearing, NAD NEURO:  Alert and oriented x 3, no focal deficits EYES:  eyes equal and reactive ENT/NECK:  no LAD, no JVD CARDIO: Regular rate, well-perfused, normal S1 and S2 PULM:  CTAB no wheezing or rhonchi GI/GU:  normal bowel sounds, non-distended, non-tender MSK/SPINE:  No gross deformities, no edema SKIN:  no rash, atraumatic PSYCH:  Appropriate speech and behavior  *Additional and/or pertinent findings included in MDM below  Diagnostic and Interventional  Summary    EKG Interpretation  Date/Time:  Wednesday January 23 2021 00:02:59 EDT Ventricular Rate:  103 PR Interval:    QRS Duration: 95 QT Interval:  362 QTC Calculation: 474 R Axis:   109 Text Interpretation: Atrial fibrillation Right axis deviation Low voltage, precordial leads Minimal ST depression, anterolateral leads Confirmed by Gerlene Fee [phone]) on 01/23/2021 12:17:15 AM       Labs Reviewed  RESP PANEL BY RT-PCR (FLU A&B, COVID) ARPGX2 - Abnormal; Notable for the following components:      Result Value   SARS Coronavirus 2 by RT PCR POSITIVE (*)    All other components within normal limits  CBC - Abnormal; Notable for the following components:   WBC 12.0 (*)    All other components within normal limits  COMPREHENSIVE METABOLIC PANEL - Abnormal; Notable for the following components:    Sodium 134 (*)    Glucose, Bld 335 (*)    BUN 26 (*)    Creatinine, Ser 1.47 (*)    Calcium 8.6 (*)    Albumin 3.2 (*)    AST 14 (*)    GFR, Estimated 37 (*)    All other components within normal limits  URINALYSIS, ROUTINE W REFLEX MICROSCOPIC - Abnormal; Notable for the following components:   APPearance CLOUDY (*)    Glucose, UA >=500 (*)    Hgb urine dipstick MODERATE (*)    Protein, ur 100 (*)    Nitrite POSITIVE (*)    Leukocytes,Ua LARGE (*)    WBC, UA >50 (*)    Bacteria, UA MANY (*)    All other components within normal limits  PROTIME-INR - Abnormal; Notable for the following components:   Prothrombin Time 17.1 (*)    INR 1.4 (*)    All other components within normal limits  APTT - Abnormal; Notable for the following components:   aPTT 42 (*)    All other components within normal limits  CULTURE, BLOOD (SINGLE)  LACTIC ACID, PLASMA    CT HEAD WO CONTRAST (5MM)  Final Result    DG Hips Bilat W or Wo Pelvis 2 Views  Final Result    DG Lumbar Spine Complete  Final Result    DG Chest 1 View  Final Result      Medications  cefTRIAXone (ROCEPHIN) 2 g in sodium chloride 0.9 % 100 mL IVPB (0 g Intravenous Stopped 01/23/21 0109)  fentaNYL (SUBLIMAZE) injection 25 mcg (25 mcg Intravenous Given 01/23/21 0006)  lactated ringers bolus 1,000 mL (1,000 mLs Intravenous New Bag/Given 01/23/21 0108)  acetaminophen (TYLENOL) tablet 1,000 mg (1,000 mg Oral Given 01/23/21 0009)  HYDROmorphone (DILAUDID) injection 1 mg (1 mg Intravenous Given 01/23/21 0109)     Procedures  /  Critical Care .Critical Care Performed by: Maudie Flakes, MD Authorized by: Maudie Flakes, MD   Critical care provider statement:    Critical care time (minutes):  45   Critical care was necessary to treat or prevent imminent or life-threatening deterioration of the following conditions:  Sepsis   Critical care was time spent personally by me on the following activities:  Discussions with  consultants, evaluation of patient's response to treatment, examination of patient, ordering and performing treatments and interventions, ordering and review of laboratory studies, ordering and review of radiographic studies, pulse oximetry, re-evaluation of patient's condition, obtaining history from patient or surrogate and review of old charts  ED Course and Medical Decision Making  I have reviewed the triage vital  signs, the nursing notes, and pertinent available records from the EMR.  Listed above are laboratory and imaging tests that I personally ordered, reviewed, and interpreted and then considered in my medical decision making (see below for details).  Febrile, tachycardic, tachypneic, enough SIRS criteria to justify code sepsis initiation.  Unclear febrile source at this time.  Awaiting labs, chest x-ray, urinalysis.     Work-up reveals likely UTI, mild AKI, patient is also positive for COVID-19.  Will admit to medicine.  [name]. [name], MD [place] [email or web address]  Final Clinical Impressions(s) / ED Diagnoses     ICD-10-CM   1. COVID-19  U07.1     2. Fall  W19.[facility]err[facility] Abbe DG Chest 1 View    DG Chest 1 View    3. Fever  R50.9 DG Chest 1 View    DG Chest 1 View    4. AKI (acute kidney injury) ([place])  N17.9     5. Acute cystitis without hematuria  N30.00     6. Fall, initial encounter  W19.[facility]       ED Discharge Orders     None        Discharge Instructions Discussed with and Provided to Patient:   Discharge Instructions   None       Maudie Flakes, MD 01/23/21 [phone]

## 2021-01-22 NOTE — ED Triage Notes (Addendum)
Pt came from home via EMS. C/c: fall with bilateral knee pain, pt has hx of chronic knee pain, but pain is worse today. No head injury, no LOC, pt is on eliquis. Pt afebrile with EMS, but febrile at ED. Pt states "I felt freezing cold at home for the past couple of days but I never checked my temperature." Pt states "I dont really remember what happened when I fell, but I know I fell directly on my bottom." 148/101 102 94% 10/10 pain in bilateral knees, 3/10 pain in low back.

## 2021-01-23 ENCOUNTER — Emergency Department (HOSPITAL_COMMUNITY): Payer: Medicare Other

## 2021-01-23 ENCOUNTER — Other Ambulatory Visit (HOSPITAL_COMMUNITY): Payer: Medicare Other

## 2021-01-23 ENCOUNTER — Observation Stay (HOSPITAL_COMMUNITY): Payer: Medicare Other

## 2021-01-23 ENCOUNTER — Encounter (HOSPITAL_COMMUNITY): Payer: Self-pay | Admitting: Internal Medicine

## 2021-01-23 DIAGNOSIS — N179 Acute kidney failure, unspecified: Secondary | ICD-10-CM | POA: Diagnosis present

## 2021-01-23 DIAGNOSIS — G8929 Other chronic pain: Secondary | ICD-10-CM | POA: Diagnosis present

## 2021-01-23 DIAGNOSIS — I4891 Unspecified atrial fibrillation: Secondary | ICD-10-CM | POA: Diagnosis present

## 2021-01-23 DIAGNOSIS — J9611 Chronic respiratory failure with hypoxia: Secondary | ICD-10-CM | POA: Diagnosis present

## 2021-01-23 DIAGNOSIS — U071 COVID-19: Secondary | ICD-10-CM | POA: Diagnosis present

## 2021-01-23 DIAGNOSIS — N390 Urinary tract infection, site not specified: Secondary | ICD-10-CM | POA: Diagnosis present

## 2021-01-23 DIAGNOSIS — R509 Fever, unspecified: Secondary | ICD-10-CM

## 2021-01-23 DIAGNOSIS — N3000 Acute cystitis without hematuria: Secondary | ICD-10-CM | POA: Diagnosis not present

## 2021-01-23 LAB — RESP PANEL BY RT-PCR (FLU A&B, COVID) ARPGX2
Influenza A by PCR: NEGATIVE
Influenza B by PCR: NEGATIVE
SARS Coronavirus 2 by RT PCR: POSITIVE — AB

## 2021-01-23 LAB — COMPREHENSIVE METABOLIC PANEL
ALT: 11 U/L (ref 0–44)
ALT: 12 U/L (ref 0–44)
AST: 14 U/L — ABNORMAL LOW (ref 15–41)
AST: 13 U/L — ABNORMAL LOW (ref 15–41)
Albumin: 3.2 g/dL — ABNORMAL LOW (ref 3.5–5.0)
Albumin: 3 g/dL — ABNORMAL LOW (ref 3.5–5.0)
Alkaline Phosphatase: 59 U/L (ref 38–126)
Alkaline Phosphatase: 54 U/L (ref 38–126)
Anion gap: 9 (ref 5–15)
Anion gap: 10 (ref 5–15)
BUN: 26 mg/dL — ABNORMAL HIGH (ref 8–23)
BUN: 22 mg/dL (ref 8–23)
CO2: 26 mmol/L (ref 22–32)
CO2: 23 mmol/L (ref 22–32)
Calcium: 8.6 mg/dL — ABNORMAL LOW (ref 8.9–10.3)
Calcium: 8.5 mg/dL — ABNORMAL LOW (ref 8.9–10.3)
Chloride: 99 mmol/L (ref 98–111)
Chloride: 103 mmol/L (ref 98–111)
Creatinine, Ser: 1.47 mg/dL — ABNORMAL HIGH (ref 0.44–1.00)
Creatinine, Ser: 1.03 mg/dL — ABNORMAL HIGH (ref 0.44–1.00)
GFR, Estimated: 37 mL/min — ABNORMAL LOW (ref >60)
GFR, Estimated: 57 mL/min — ABNORMAL LOW (ref >60)
Glucose, Bld: 335 mg/dL — ABNORMAL HIGH (ref 70–99)
Glucose, Bld: 282 mg/dL — ABNORMAL HIGH (ref 70–99)
Potassium: 4.6 mmol/L (ref 3.5–5.1)
Potassium: 4.3 mmol/L (ref 3.5–5.1)
Sodium: 134 mmol/L — ABNORMAL LOW (ref 135–145)
Sodium: 136 mmol/L (ref 135–145)
Total Bilirubin: 0.9 mg/dL (ref 0.3–1.2)
Total Bilirubin: 1.1 mg/dL (ref 0.3–1.2)
Total Protein: 7.9 g/dL (ref 6.5–8.1)
Total Protein: 7.5 g/dL (ref 6.5–8.1)

## 2021-01-23 LAB — CBC WITH DIFFERENTIAL/PLATELET
Abs Immature Granulocytes: 0.07 10*3/uL (ref 0.00–0.07)
Basophils Absolute: 0 10*3/uL (ref 0.0–0.1)
Basophils Relative: 0 %
Eosinophils Absolute: 0 10*3/uL (ref 0.0–0.5)
Eosinophils Relative: 0 %
HCT: 36.6 % (ref 36.0–46.0)
Hemoglobin: 12 g/dL (ref 12.0–15.0)
Immature Granulocytes: 1 %
Lymphocytes Relative: 6 %
Lymphs Abs: 0.6 10*3/uL — ABNORMAL LOW (ref 0.7–4.0)
MCH: 31.3 pg (ref 26.0–34.0)
MCHC: 32.8 g/dL (ref 30.0–36.0)
MCV: 95.3 fL (ref 80.0–100.0)
Monocytes Absolute: 0.7 10*3/uL (ref 0.1–1.0)
Monocytes Relative: 7 %
Neutro Abs: 8.5 10*3/uL — ABNORMAL HIGH (ref 1.7–7.7)
Neutrophils Relative %: 86 %
Platelets: 151 10*3/uL (ref 150–400)
RBC: 3.84 MIL/uL — ABNORMAL LOW (ref 3.87–5.11)
RDW: 14.8 % (ref 11.5–15.5)
WBC: 9.9 10*3/uL (ref 4.0–10.5)
nRBC: 0 % (ref 0.0–0.2)

## 2021-01-23 LAB — HEMOGLOBIN A1C
Hgb A1c MFr Bld: 8.4 % — ABNORMAL HIGH (ref 4.8–5.6)
Mean Plasma Glucose: 194.38 mg/dL

## 2021-01-23 LAB — URINALYSIS, ROUTINE W REFLEX MICROSCOPIC
Bilirubin Urine: NEGATIVE
Glucose, UA: >=500 mg/dL — AB
Ketones, ur: NEGATIVE mg/dL
Nitrite: POSITIVE — AB
Protein, ur: 100 mg/dL — AB
Specific Gravity, Urine: 1.016 (ref 1.005–1.030)
WBC, UA: >50 WBC/hpf — ABNORMAL HIGH (ref 0–5)
pH: 5 (ref 5.0–8.0)

## 2021-01-23 LAB — C-REACTIVE PROTEIN: CRP: 18.6 mg/dL — ABNORMAL HIGH (ref <1.0)

## 2021-01-23 LAB — CBC
HCT: 39 % (ref 36.0–46.0)
Hemoglobin: 12.7 g/dL (ref 12.0–15.0)
MCH: 31.1 pg (ref 26.0–34.0)
MCHC: 32.6 g/dL (ref 30.0–36.0)
MCV: 95.4 fL (ref 80.0–100.0)
Platelets: 187 10*3/uL (ref 150–400)
RBC: 4.09 MIL/uL (ref 3.87–5.11)
RDW: 14.6 % (ref 11.5–15.5)
WBC: 12 10*3/uL — ABNORMAL HIGH (ref 4.0–10.5)
nRBC: 0 % (ref 0.0–0.2)

## 2021-01-23 LAB — CBG MONITORING, ED
Glucose-Capillary: 275 mg/dL — ABNORMAL HIGH (ref 70–99)
Glucose-Capillary: 354 mg/dL — ABNORMAL HIGH (ref 70–99)
Glucose-Capillary: 213 mg/dL — ABNORMAL HIGH (ref 70–99)
Glucose-Capillary: 214 mg/dL — ABNORMAL HIGH (ref 70–99)

## 2021-01-23 LAB — D-DIMER, QUANTITATIVE: D-Dimer, Quant: 1.61 ug/mL-FEU — ABNORMAL HIGH (ref 0.00–0.50)

## 2021-01-23 LAB — PROCALCITONIN: Procalcitonin: 0.4 ng/mL

## 2021-01-23 LAB — LACTIC ACID, PLASMA: Lactic Acid, Venous: 1 mmol/L (ref 0.5–1.9)

## 2021-01-23 LAB — APTT: aPTT: 42 seconds — ABNORMAL HIGH (ref 24–36)

## 2021-01-23 LAB — PROTIME-INR
INR: 1.4 — ABNORMAL HIGH (ref 0.8–1.2)
Prothrombin Time: 17.1 seconds — ABNORMAL HIGH (ref 11.4–15.2)

## 2021-01-23 MED ORDER — ONDANSETRON HCL 4 MG/2ML IJ SOLN: 4.0000 mg | Freq: Four times a day (QID) | INTRAMUSCULAR | Status: DC | PRN

## 2021-01-23 MED ORDER — GABAPENTIN 300 MG PO CAPS
300.0000 mg | ORAL_CAPSULE | Freq: Two times a day (BID) | ORAL | Status: DC
  Administered 2021-01-23: 300 mg via ORAL
  Filled 2021-01-23: qty 1

## 2021-01-23 MED ORDER — SODIUM CHLORIDE 0.9 % IV SOLN: 200.0000 mg | Freq: Once | INTRAVENOUS | Status: DC

## 2021-01-23 MED ORDER — ONDANSETRON HCL 4 MG PO TABS: 4.0000 mg | ORAL_TABLET | Freq: Four times a day (QID) | ORAL | Status: DC | PRN

## 2021-01-23 MED ORDER — SODIUM CHLORIDE 0.9 % IV SOLN
100.0000 mg | Freq: Once | INTRAVENOUS | Status: AC
  Administered 2021-01-23: 100 mg via INTRAVENOUS
  Filled 2021-01-23: qty 20

## 2021-01-23 MED ORDER — INSULIN ASPART 100 UNIT/ML IJ SOLN
0.0000 [IU] | Freq: Three times a day (TID) | INTRAMUSCULAR | Status: DC
  Administered 2021-01-23: 8 [IU] via SUBCUTANEOUS
  Administered 2021-01-23 (×2): 5 [IU] via SUBCUTANEOUS
  Filled 2021-01-23: qty 0.15

## 2021-01-23 MED ORDER — HYDROMORPHONE HCL 1 MG/ML IJ SOLN
1.0000 mg | Freq: Once | INTRAMUSCULAR | Status: AC
Start: 2021-01-23 — End: 2021-01-23
  Administered 2021-01-23: 1 mg via INTRAVENOUS
  Filled 2021-01-23: qty 1

## 2021-01-23 MED ORDER — APIXABAN 5 MG PO TABS
5.0000 mg | ORAL_TABLET | Freq: Two times a day (BID) | ORAL | Status: DC
  Administered 2021-01-23: 5 mg via ORAL
  Filled 2021-01-23 (×2): qty 1

## 2021-01-23 MED ORDER — DULOXETINE HCL 30 MG PO CPEP
60.0000 mg | ORAL_CAPSULE | Freq: Every day | ORAL | Status: DC
  Administered 2021-01-23: 60 mg via ORAL
  Filled 2021-01-23: qty 2

## 2021-01-23 MED ORDER — CEPHALEXIN 250 MG PO CAPS: 250.0000 mg | ORAL_CAPSULE | Freq: Four times a day (QID) | ORAL | 0 refills | Status: AC

## 2021-01-23 MED ORDER — ACETAMINOPHEN 325 MG PO TABS: 650.0000 mg | ORAL_TABLET | Freq: Four times a day (QID) | ORAL | Status: DC | PRN

## 2021-01-23 MED ORDER — CARVEDILOL 12.5 MG PO TABS
12.5000 mg | ORAL_TABLET | Freq: Two times a day (BID) | ORAL | Status: DC
  Administered 2021-01-23: 12.5 mg via ORAL
  Filled 2021-01-23: qty 1

## 2021-01-23 MED ORDER — SODIUM CHLORIDE 0.9 % IV SOLN: 100.0000 mg | Freq: Every day | INTRAVENOUS | Status: DC

## 2021-01-23 MED ORDER — OXYCODONE-ACETAMINOPHEN 7.5-325 MG PO TABS
1.0000 | ORAL_TABLET | ORAL | Status: DC | PRN
  Administered 2021-01-23 (×3): 1 via ORAL
  Filled 2021-01-23 (×3): qty 1

## 2021-01-23 MED ORDER — REMDESIVIR 100 MG IV SOLR
100.0000 mg | Freq: Every day | INTRAVENOUS | Status: DC
Start: 2021-01-24 — End: 2021-01-23

## 2021-01-23 NOTE — H&P (Addendum)
History and Physical    [name] JTT:[id number] DOB: 1945-11-19 DOA: 01/22/2021  PCP: Kristopher Glee., MD  Patient coming from: Home  I have personally briefly reviewed patient's old medical records in [place]  Chief Complaint: Fall  HPI: Sheryl Aguilar is a 75 y.o. female with medical history significant of A.Fib on eliquis, chronic pain, HTN, DM2, chronic 3L o2 requirement.Marland Kitchen  Pt presents to the ED after a fall at home.  Patient was standing up from the commode and lost her balance and fell.  No LOC.  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  Initially having pain in bottom.  Has had chills over past day or 2.  No SOB more than baseline, no cough, no CP.   ED Course: COVID+ and has UTI and mild AKI.  At time of my eval, primary complaint is B knee pain.  This is baseline for her she states, percocet she takes at home normally works for pain.  Knee pain not new or different today.   Review of Systems: As per HPI, otherwise all review of systems negative.  Past Medical History:  Diagnosis Date   A-fib ([place])    Arthritis    Breast CA (HCC)    Chronic pain    Diabetes mellitus without complication ([place])    DVT (deep venous thrombosis) ([place])    Hypertension     Past Surgical History:  Procedure Laterality Date   BREAST LUMPECTOMY     CHOLECYSTECTOMY     LYMPHADENECTOMY     TONSILLECTOMY       reports that she has quit smoking. She has never used smokeless tobacco. She reports that she does not drink alcohol and does not use drugs.  Allergies  Allergen Reactions   Filgrastim Swelling and Shortness Of Breath    Hypotension.    Sargramostim Shortness Of Breath    Family History  Problem Relation Age of Onset   Stroke Mother    Hypertension Mother    Diabetes Mother    Cancer Mother    Heart disease Father    COPD Father    Cancer Paternal Grandfather      Prior to Admission medications   Medication Sig  Start Date End Date Taking? Authorizing Provider  apixaban (ELIQUIS) 5 MG TABS tablet Take 5 mg by mouth 2 (two) times daily.   Yes [provider]  carvedilol (COREG) 12.5 MG tablet Take 12.5 mg by mouth 2 (two) times daily.   Yes [provider]  cholecalciferol (VITAMIN D) 25 MCG (1000 UNIT) tablet Take 1,000 Units by mouth daily.   Yes [provider]  DULoxetine (CYMBALTA) 60 MG capsule Take 60 mg by mouth daily.   Yes [provider]  furosemide (LASIX) 20 MG tablet Take 20 mg by mouth 2 (two) times daily.   Yes [provider]  metFORMIN (GLUCOPHAGE) 1000 MG tablet Take 1,000 mg by mouth 2 (two) times daily with a meal.   Yes [provider]  oxyCODONE-acetaminophen (PERCOCET) 7.5-325 MG tablet Take 1 tablet by mouth every 4 (four) hours as needed for severe pain.   Yes [provider]  Semaglutide,0.25 or 0.5MG /DOS, 2 MG/1.5ML SOPN Inject 0.5 mg into the skin See admin instructions. Inject 17mL into the skin once weekly every Friday 05/01/20  Yes [provider]  gabapentin (NEURONTIN) 300 MG capsule Take 300 mg by mouth 2 (two) times daily. May take 1 additional capsule at  bedtime as needed for sleep    [provider]    Physical Exam: Vitals:   01/23/21 0200 01/23/21 0255 01/23/21 0300 01/23/21 0414  BP: (!) 154/89 (!) 150/83 (!) 148/78 (!) 172/99  Pulse: 98 98 98 92  Resp: (!) 27 17 19  (!) 24  Temp:      TempSrc:      SpO2: 95% 100% 97% 98%  Weight:      Height:        Constitutional: Tearful, uncomfortable Eyes: PERRL, lids and conjunctivae normal ENMT: Mucous membranes are moist. Posterior pharynx clear of any exudate or lesions.Normal dentition.  Neck: normal, supple, no masses, no thyromegaly Respiratory: clear to auscultation bilaterally, no wheezing, no crackles. Normal respiratory effort. No accessory muscle use.  Cardiovascular: Regular rate and rhythm, no murmurs / rubs / gallops. No  extremity edema. 2+ pedal pulses. No carotid bruits.  Abdomen: no tenderness, no masses palpated. No hepatosplenomegaly. Bowel sounds positive.  Musculoskeletal: no clubbing / cyanosis. No joint deformity upper and lower extremities. Good ROM, no contractures. Normal muscle tone.  Skin: no rashes, lesions, ulcers. No induration Neurologic: CN 2-12 grossly intact. Sensation intact, DTR normal. Strength 5/5 in all 4.  Psychiatric: Normal judgment and insight. Alert and oriented x 3. Normal mood.    Labs on Admission: I have personally reviewed following labs and imaging studies  CBC: Recent Labs  Lab 01/23/21 0000  WBC 12.0*  HGB 12.7  HCT 39.0  MCV 95.4  PLT 294   Basic Metabolic Panel: Recent Labs  Lab 01/23/21 0000  NA 134*  K 4.6  CL 99  CO2 26  GLUCOSE 335*  BUN 26*  CREATININE 1.47*  CALCIUM 8.6*   GFR: Estimated Creatinine Clearance: 46.7 mL/min (A) (by C-G formula based on SCr of 1.47 mg/dL (H)). Liver Function Tests: Recent Labs  Lab 01/23/21 0000  AST 14*  ALT 11  ALKPHOS 59  BILITOT 0.9  PROT 7.9  ALBUMIN 3.2*   No results for input(s): LIPASE, AMYLASE in the last 168 hours. No results for input(s): AMMONIA in the last 168 hours. Coagulation Profile: Recent Labs  Lab 01/23/21 0000  INR 1.4*   Cardiac Enzymes: No results for input(s): CKTOTAL, CKMB, CKMBINDEX, TROPONINI in the last 168 hours. BNP (last 3 results) No results for input(s): PROBNP in the last 8760 hours. HbA1C: No results for input(s): HGBA1C in the last 72 hours. CBG: No results for input(s): GLUCAP in the last 168 hours. Lipid Profile: No results for input(s): CHOL, HDL, LDLCALC, TRIG, CHOLHDL, LDLDIRECT in the last 72 hours. Thyroid Function Tests: No results for input(s): TSH, T4TOTAL, FREET4, T3FREE, THYROIDAB in the last 72 hours. Anemia Panel: No results for input(s): VITAMINB12, FOLATE, FERRITIN, TIBC, IRON, RETICCTPCT in the last 72 hours. Urine analysis:     Component Value Date/Time   COLORURINE YELLOW 01/23/2021 0030   APPEARANCEUR CLOUDY (A) 01/23/2021 0030   LABSPEC 1.016 01/23/2021 0030   PHURINE 5.0 01/23/2021 0030   GLUCOSEU >=500 (A) 01/23/2021 0030   HGBUR MODERATE (A) 01/23/2021 0030   BILIRUBINUR NEGATIVE 01/23/2021 0030   KETONESUR NEGATIVE 01/23/2021 0030   PROTEINUR 100 (A) 01/23/2021 0030   UROBILINOGEN 1.0 05/18/2010 1430   NITRITE POSITIVE (A) 01/23/2021 0030   LEUKOCYTESUR LARGE (A) 01/23/2021 0030    Radiological Exams on Admission: DG Chest 1 View  Result Date: 01/23/2021 CLINICAL DATA:  Fevers, recent fall EXAM: PORTABLE CHEST 1 VIEW COMPARISON:  06/19/2020 FINDINGS: Cardiac shadow is mildly prominent but  stable. The lungs are well aerated bilaterally. No focal infiltrate or effusion is seen. No bony abnormality is noted. Postsurgical changes in the left axilla are seen. IMPRESSION: No active disease. Electronically Signed   By: Inez Catalina M.D.   On: 01/23/2021 01:11   DG Lumbar Spine Complete  Result Date: 01/23/2021 CLINICAL DATA:  Recent fall with low back pain, initial encounter EXAM: LUMBAR SPINE - COMPLETE 4+ VIEW COMPARISON:  None. FINDINGS: Five lumbar type vertebral bodies are well visualized. Vertebral body height is well maintained. No pars defects are noted. No anterolisthesis is seen. Mild osteophytic changes are noted. Facet hypertrophic changes are seen. IMPRESSION: Degenerative change without acute abnormality. Electronically Signed   By: Inez Catalina M.D.   On: 01/23/2021 01:09   CT HEAD WO CONTRAST (5MM)  Result Date: 01/23/2021 CLINICAL DATA:  Recent fall on blood thinners, initial encounter EXAM: CT HEAD WITHOUT CONTRAST TECHNIQUE: Contiguous axial images were obtained from the base of the skull through the vertex without intravenous contrast. COMPARISON:  06/19/2020 FINDINGS: Brain: No evidence of acute infarction, hemorrhage, hydrocephalus, extra-axial collection or mass lesion/mass effect.  Vascular: No hyperdense vessel or unexpected calcification. Skull: Normal. Negative for fracture or focal lesion. Sinuses/Orbits: No acute finding. Other: None. IMPRESSION: No acute intracranial abnormality noted. Electronically Signed   By: Inez Catalina M.D.   On: 01/23/2021 01:17   DG Hips Bilat W or Wo Pelvis 2 Views  Result Date: 01/23/2021 CLINICAL DATA:  Recent fall with hip pain, initial encounter EXAM: DG HIP (WITH OR WITHOUT PELVIS) 2V BILAT COMPARISON:  None. FINDINGS: Pelvic ring is intact. Degenerative changes of lumbar spine and hip joints are seen. No acute fracture or dislocation is noted. No soft tissue abnormality is seen. IMPRESSION: Degenerative changes of the hip joints without acute bony abnormality. Electronically Signed   By: Inez Catalina M.D.   On: 01/23/2021 01:09    EKG: Independently reviewed.  Assessment/Plan Principal Problem:   COVID-19 virus infection Active Problems:   UTI (urinary tract infection)   AKI (acute kidney injury) (HCC)   Chronic respiratory failure with hypoxia (HCC)   A-fib (HCC)   Chronic pain    COVID-19 virus infection - Currently satting mid 90s on her baseline 3L home O2 Did have fever initially in ED, unclear if this was from [place] or from UTI. Will offer treatment for COVID with remdesivir not paxlovid candidate due to being on eliquis Covid pathway Cont pulse ox Daily labs UTI - Empiric rocephin Culture ordered and pending AKI - Likely due to COVID / UTI 1L LR bolus in ED Holding lasix Strict intake and output Trend creat HTN - Cont coreg A.Fib - Tele monitor Cont Coreg Cont Eliquis Chronic pain - Cont neurontin Cont PRN percocet for knee pain Will get X rays of B knees since it doesn't look like this has been done in a while, and given pt build, I would be completely un-surprised to find out that she needs a knee replacement or some such. X rays just came back: bone on bone severe arthritis May want to follow up  with ortho Though pt tells me she has been told previously that she weighs too much for TKA. Chronic resp failure with hypoxia - Satting well on her baseline 3L o2 No evidence of worsening at this point CXR neg despite COVID diagnosis.  DVT prophylaxis: Eliquis Code Status: Full Family Communication: No family in room Disposition Plan: Home after admit Consults called: None Admission status: Place in  obs     Rylin Seavey, [facility]  How to contact the [facility] Attending or Consulting provider [place] or covering provider during after hours [place], for this patient?  Check the care team in [facility] and look for a) attending/consulting [facility] provider listed and b) the [facility] team listed Log into [email or web address]  Amion Physician Scheduling and messaging for groups and whole hospitals  On call and physician scheduling software for group practices, residents, hospitalists and other medical providers for call, clinic, rotation and shift schedules. On[employer] is a hospital-wide system for scheduling doctors and paging doctors on call. EasyPlot is for scientific plotting and data analysis.  [email or web address]  and use [facility]'s universal password to access. If you do not have the password, please contact the hospital operator.  Locate the [facility] provider you are looking for under [facility] and page to a number that you can be directly reached. If you still have difficulty reaching the provider, please page the [facility] (Director on Call) for the Hospitalists listed on amion for assistance.  01/23/2021, 6:14 AM

## 2021-01-23 NOTE — ED Notes (Signed)
This RN called [facility] to check on status of transport. [facility] representative verbalized that transport trucks are still working on rides that were called at 1300

## 2021-01-23 NOTE — ED Notes (Signed)
Bed placement called and made aware patient is getting discharged. Waiting for [facility].

## 2021-01-23 NOTE — ED Notes (Signed)
Pt given meal tray.

## 2021-01-23 NOTE — Discharge Summary (Signed)
Physician Discharge Summary  Sheryl Aguilar GQQ:[id number] DOB: [birth year] DOA: 01/22/2021  PCP: Kristopher Glee., MD  Admit date: 01/22/2021 Discharge date: 01/23/2021  Admitted From: Home Disposition:  Home  Recommendations for Outpatient Follow-up:  Follow up with PCP in 1-2 weeks Please obtain BMP/CBC in one week Please follow up with urology as scheduled  Discharge Condition:Stable  CODE STATUS:Full  Diet recommendation: Diabetic diet    Brief/Interim Summary: Sheryl Aguilar is a 75 y.o. female with medical history significant of A.Fib on eliquis, chronic pain, HTN, DM2, chronic 3L o2 requirement. Pt presents to the ED after a fall at home. Patient was standing up from the commode and lost her balance and fell. No LOC. 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 Initially having pain in bottom after the fall. Has had chills/urinary urgency over past day or 2.  Patient incidentally noted to be COVID-positive, not felt to be overtly symptomatic given negative chest x-ray on baseline oxygen without any overt hypoxia from baseline with no complaints of shortness of breath nausea vomiting or other associated symptoms of COVID.  Review discussion with daughter over the phone about patient's diagnosis prognosis and discharge plans.  Patient otherwise stable for discharge back to previous living facility with close follow-up with PCP in the next 1 to 2 weeks as scheduled.  We discussed that should she have worsening respiratory symptoms or fevers chills nausea vomiting diarrhea or poor p.o. intake she should report back to the ED as these may be signs of acute COVID infection and would warrant treatment otherwise given no hypoxia x-ray findings or symptoms we will discontinue Remdesivir and otherwise discharge back home.  No medication changes other than antibiotics with cephalexin as below.  Discharge Instructions  Discharge Instructions     Call MD for:  extreme  fatigue   Complete by: As directed    Call MD for:  temperature >100.4   Complete by: As directed    Diet - low sodium heart healthy   Complete by: As directed    Increase activity slowly   Complete by: As directed       Allergies as of 01/23/2021       Reactions   Filgrastim Swelling, Shortness Of Breath   Hypotension.   Sargramostim Shortness Of Breath        Medication List     TAKE these medications    apixaban 5 MG Tabs tablet Commonly known as: ELIQUIS Take 5 mg by mouth 2 (two) times daily.   carvedilol 12.5 MG tablet Commonly known as: COREG Take 12.5 mg by mouth 2 (two) times daily.   cephALEXin 250 MG capsule Commonly known as: KEFLEX Take 1 capsule (250 mg total) by mouth 4 (four) times daily for 4 days.   cholecalciferol 25 MCG (1000 UNIT) tablet Commonly known as: VITAMIN D Take 1,000 Units by mouth daily.   DULoxetine 60 MG capsule Commonly known as: CYMBALTA Take 60 mg by mouth daily.   furosemide 20 MG tablet Commonly known as: LASIX Take 20 mg by mouth 2 (two) times daily.   gabapentin 300 MG capsule Commonly known as: NEURONTIN Take 300 mg by mouth 2 (two) times daily. May take 1 additional capsule at bedtime as needed for sleep   metFORMIN 1000 MG tablet Commonly known as: GLUCOPHAGE Take 1,000 mg by mouth 2 (two) times daily with a meal.   oxyCODONE-acetaminophen 7.5-325 MG tablet Commonly known as: PERCOCET Take 1 tablet by mouth  every 4 (four) hours as needed for severe pain.   Semaglutide(0.25 or 0.5MG /DOS) 2 MG/1.5ML Sopn Inject 0.5 mg into the skin See admin instructions. Inject 26mL into the skin once weekly every Friday        Allergies  Allergen Reactions   Filgrastim Swelling and Shortness Of Breath    Hypotension.    Sargramostim Shortness Of Breath    Procedures/Studies: DG Chest 1 View  Result Date: 01/23/2021 CLINICAL DATA:  Fevers, recent fall EXAM: PORTABLE CHEST 1 VIEW COMPARISON:  06/19/2020 FINDINGS:  Cardiac shadow is mildly prominent but stable. The lungs are well aerated bilaterally. No focal infiltrate or effusion is seen. No bony abnormality is noted. Postsurgical changes in the left axilla are seen. IMPRESSION: No active disease. Electronically Signed   By: Inez Catalina M.D.   On: 01/23/2021 01:11   DG Lumbar Spine Complete  Result Date: 01/23/2021 CLINICAL DATA:  Recent fall with low back pain, initial encounter EXAM: LUMBAR SPINE - COMPLETE 4+ VIEW COMPARISON:  None. FINDINGS: Five lumbar type vertebral bodies are well visualized. Vertebral body height is well maintained. No pars defects are noted. No anterolisthesis is seen. Mild osteophytic changes are noted. Facet hypertrophic changes are seen. IMPRESSION: Degenerative change without acute abnormality. Electronically Signed   By: Inez Catalina M.D.   On: 01/23/2021 01:09   DG Knee 1-2 Views Left  Result Date: 01/23/2021 CLINICAL DATA:  75 year old female with chronic knee pain.  Fall. EXAM: LEFT KNEE - 1-2 VIEW COMPARISON:  Left knee series 03/25/2020. FINDINGS: Chronic severe medial compartment joint space loss and spurring. Moderate to severe patellofemoral joint space loss and spurring. Mild to moderate lateral compartment degeneration and spurring with some chondrocalcinosis there. Patella appears stable and intact. No acute osseous abnormality identified. No definite joint effusion on cross-table lateral view. IMPRESSION: Chronic severe degeneration, maximal in the medial and patellofemoral compartments. No acute osseous abnormality identified. Electronically Signed   By: Genevie Ann M.D.   On: 01/23/2021 06:28   DG Knee 1-2 Views Right  Result Date: 01/23/2021 CLINICAL DATA:  75 year old female with history of trauma from a fall complaining of right knee pain. EXAM: RIGHT KNEE - 1-2 VIEW COMPARISON:  No priors. FINDINGS: Two views of the right knee demonstrate no acute displaced fracture or dislocation. There is severe joint space  narrowing, subchondral sclerosis, subchondral cyst formation and osteophyte formation, most severe in the medial and patellofemoral compartments. Enthesophytes are noted off the superior and inferior aspect of the patella. Soft tissues are unremarkable. IMPRESSION: 1. No acute radiographic abnormality of the right knee. 2. Severe tricompartmental osteoarthritis, most pronounced in the medial and patellofemoral compartments, as above. Electronically Signed   By: Vinnie Langton M.D.   On: 01/23/2021 06:27   CT HEAD WO CONTRAST (5MM)  Result Date: 01/23/2021 CLINICAL DATA:  Recent fall on blood thinners, initial encounter EXAM: CT HEAD WITHOUT CONTRAST TECHNIQUE: Contiguous axial images were obtained from the base of the skull through the vertex without intravenous contrast. COMPARISON:  06/19/2020 FINDINGS: Brain: No evidence of acute infarction, hemorrhage, hydrocephalus, extra-axial collection or mass lesion/mass effect. Vascular: No hyperdense vessel or unexpected calcification. Skull: Normal. Negative for fracture or focal lesion. Sinuses/Orbits: No acute finding. Other: None. IMPRESSION: No acute intracranial abnormality noted. Electronically Signed   By: Inez Catalina M.D.   On: 01/23/2021 01:17   DG Hips Bilat W or Wo Pelvis 2 Views  Result Date: 01/23/2021 CLINICAL DATA:  Recent fall with hip pain, initial encounter EXAM:  DG HIP (WITH OR WITHOUT PELVIS) 2V BILAT COMPARISON:  None. FINDINGS: Pelvic ring is intact. Degenerative changes of lumbar spine and hip joints are seen. No acute fracture or dislocation is noted. No soft tissue abnormality is seen. IMPRESSION: Degenerative changes of the hip joints without acute bony abnormality. Electronically Signed   By: Inez Catalina M.D.   On: 01/23/2021 01:09     Subjective: No acute issues events overnight denies nausea vomiting diarrhea constipation headache fevers chills shortness of breath or chest pain   Discharge Exam: Vitals:   01/23/21 1229  01/23/21 1300  BP:  116/73  Pulse: (!) 109 94  Resp: (!) 26 (!) 24  Temp:    SpO2: 97% 96%   Vitals:   01/23/21 1200 01/23/21 1228 01/23/21 1229 01/23/21 1300  BP: (!) 153/64   116/73  Pulse: (!) 101 95 (!) 109 94  Resp: 18 (!) 26 (!) 26 (!) 24  Temp:      TempSrc:      SpO2: 100% 97% 97% 96%  Weight:      Height:        General: Pt is alert, awake, not in acute distress Cardiovascular: RRR, S1/S2 +, no rubs, no gallops Respiratory: CTA bilaterally, no wheezing, no rhonchi Abdominal: Soft, NT, ND, bowel sounds + Extremities: no edema, no cyanosis  The results of significant diagnostics from this hospitalization (including imaging, microbiology, ancillary and laboratory) are listed below for reference.    Microbiology: Recent Results (from the past 240 hour(s))  Resp Panel by RT-PCR (Flu A&B, Covid) Nasopharyngeal Swab     Status: Abnormal   Collection Time: 01/23/21 12:30 AM   Specimen: Nasopharyngeal Swab; Nasopharyngeal(NP) swabs in vial transport medium  Result Value Ref Range Status   SARS Coronavirus 2 by RT PCR POSITIVE (A) NEGATIVE Final    Comment: RESULT CALLED TO, READ BACK BY AND VERIFIED WITH: RN A BANNO AT 0139 01/23/21 CRUICKSHANK A (NOTE) SARS-CoV-2 target nucleic acids are DETECTED.  The SARS-CoV-2 RNA is generally detectable in upper respiratory specimens during the acute phase of infection. Positive results are indicative of the presence of the identified virus, but do not rule out bacterial infection or co-infection with other pathogens not detected by the test. Clinical correlation with patient history and other diagnostic information is necessary to determine patient infection status. The expected result is Negative.  Fact Sheet for Patients: EntrepreneurPulse.com.au  Fact Sheet for Healthcare Providers: IncredibleEmployment.be  This test is not yet approved or cleared by the [place] FDA and  has been  authorized for detection and/or diagnosis of SARS-CoV-2 by FDA under an Emergency Use Authorization (EUA).  This EUA will remain in effect (meaning this test  can be used) for the duration of  the COVID-19 declaration under Section 564(b)(1) of the Act, 21 U.S.C. section 360bbb-3(b)(1), unless the authorization is terminated or revoked sooner.     Influenza A by PCR NEGATIVE NEGATIVE Final   Influenza B by PCR NEGATIVE NEGATIVE Final    Comment: (NOTE) The Xpert Xpress SARS-CoV-2/FLU/RSV plus assay is intended as an aid in the diagnosis of influenza from Nasopharyngeal swab specimens and should not be used as a sole basis for treatment. Nasal washings and aspirates are unacceptable for Xpert Xpress SARS-CoV-2/FLU/RSV testing.  Fact Sheet for Patients: EntrepreneurPulse.com.au  Fact Sheet for Healthcare Providers: IncredibleEmployment.be  This test is not yet approved or cleared by the [place] FDA and has been authorized for detection and/or diagnosis of SARS-CoV-2 by FDA under  an Emergency Use Authorization (EUA). This EUA will remain in effect (meaning this test can be used) for the duration of the COVID-19 declaration under Section 564(b)(1) of the Act, 21 U.S.C. section 360bbb-3(b)(1), unless the authorization is terminated or revoked.  Performed at [facility], [place] [address], [place], [place] [address]     Labs: BNP (last 3 results) No results for input(s): BNP in the last 8760 hours. Basic Metabolic Panel: Recent Labs  Lab 01/23/21 0000 01/23/21 0622  NA 134* 136  K 4.6 4.3  CL 99 103  CO2 26 23  GLUCOSE 335* 282*  BUN 26* 22  CREATININE 1.47* 1.03*  CALCIUM 8.6* 8.5*   Liver Function Tests: Recent Labs  Lab 01/23/21 0000 01/23/21 0622  AST 14* 13*  ALT 11 12  ALKPHOS 59 54  BILITOT 0.9 1.1  PROT 7.9 7.5  ALBUMIN 3.2* 3.0*   No results for input(s): LIPASE, AMYLASE in the last 168  hours. No results for input(s): AMMONIA in the last 168 hours. CBC: Recent Labs  Lab 01/23/21 0000 01/23/21 0622  WBC 12.0* 9.9  NEUTROABS  --  8.5*  HGB 12.7 12.0  HCT 39.0 36.6  MCV 95.4 95.3  PLT 187 151   Cardiac Enzymes: No results for input(s): CKTOTAL, CKMB, CKMBINDEX, TROPONINI in the last 168 hours. BNP: Invalid input(s): POCBNP CBG: Recent Labs  Lab 01/23/21 0737 01/23/21 0933 01/23/21 1229  GLUCAP 275* 354* 213*   D-Dimer Recent Labs    01/23/21 0622  DDIMER 1.61*   Hgb A1c Recent Labs    01/23/21 0622  HGBA1C 8.4*   Lipid Profile No results for input(s): CHOL, HDL, LDLCALC, TRIG, CHOLHDL, LDLDIRECT in the last 72 hours. Thyroid function studies No results for input(s): TSH, T4TOTAL, T3FREE, THYROIDAB in the last 72 hours.  Invalid input(s): FREET3 Anemia work up No results for input(s): VITAMINB12, FOLATE, FERRITIN, TIBC, IRON, RETICCTPCT in the last 72 hours. Urinalysis    Component Value Date/Time   COLORURINE YELLOW 01/23/2021 0030   APPEARANCEUR CLOUDY (A) 01/23/2021 0030   LABSPEC 1.016 01/23/2021 0030   PHURINE 5.0 01/23/2021 0030   GLUCOSEU >=500 (A) 01/23/2021 0030   HGBUR MODERATE (A) 01/23/2021 0030   BILIRUBINUR NEGATIVE 01/23/2021 0030   KETONESUR NEGATIVE 01/23/2021 0030   PROTEINUR 100 (A) 01/23/2021 0030   UROBILINOGEN 1.0 05/18/2010 1430   NITRITE POSITIVE (A) 01/23/2021 0030   LEUKOCYTESUR LARGE (A) 01/23/2021 0030   Sepsis Labs Invalid input(s): PROCALCITONIN,  WBC,  LACTICIDVEN Microbiology Recent Results (from the past 240 hour(s))  Resp Panel by RT-PCR (Flu A&B, Covid) Nasopharyngeal Swab     Status: Abnormal   Collection Time: 01/23/21 12:30 AM   Specimen: Nasopharyngeal Swab; Nasopharyngeal(NP) swabs in vial transport medium  Result Value Ref Range Status   SARS Coronavirus 2 by RT PCR POSITIVE (A) NEGATIVE Final    Comment: RESULT CALLED TO, READ BACK BY AND VERIFIED WITH: RN A BANNO AT 0139 01/23/21  CRUICKSHANK A (NOTE) SARS-CoV-2 target nucleic acids are DETECTED.  The SARS-CoV-2 RNA is generally detectable in upper respiratory specimens during the acute phase of infection. Positive results are indicative of the presence of the identified virus, but do not rule out bacterial infection or co-infection with other pathogens not detected by the test. Clinical correlation with patient history and other diagnostic information is necessary to determine patient infection status. The expected result is Negative.  Fact Sheet for Patients: EntrepreneurPulse.com.au  Fact Sheet for Healthcare Providers: IncredibleEmployment.be  This  test is not yet approved or cleared by the [place] and  has been authorized for detection and/or diagnosis of SARS-CoV-2 by FDA under an Emergency Use Authorization (EUA).  This EUA will remain in effect (meaning this test  can be used) for the duration of  the COVID-19 declaration under Section 564(b)(1) of the Act, 21 U.S.C. section 360bbb-3(b)(1), unless the authorization is terminated or revoked sooner.     Influenza A by PCR NEGATIVE NEGATIVE Final   Influenza B by PCR NEGATIVE NEGATIVE Final    Comment: (NOTE) The Xpert Xpress SARS-CoV-2/FLU/RSV plus assay is intended as an aid in the diagnosis of influenza from Nasopharyngeal swab specimens and should not be used as a sole basis for treatment. Nasal washings and aspirates are unacceptable for Xpert Xpress SARS-CoV-2/FLU/RSV testing.  Fact Sheet for Patients: EntrepreneurPulse.com.au  Fact Sheet for Healthcare Providers: IncredibleEmployment.be  This test is not yet approved or cleared by the [place] FDA and has been authorized for detection and/or diagnosis of SARS-CoV-2 by FDA under an Emergency Use Authorization (EUA). This EUA will remain in effect (meaning this test can be used) for the duration of  the COVID-19 declaration under Section 564(b)(1) of the Act, 21 U.S.C. section 360bbb-3(b)(1), unless the authorization is terminated or revoked.  Performed at [facility], [place] [address], [place], [place] [address]    Time coordinating discharge: Over 30 minutes  SIGNED:  Little Ishikawa, DO [facility] 01/23/2021, 1:10 PM Pager   If 7PM-7AM, please contact night-coverage [email or web address]

## 2021-01-23 NOTE — ED Notes (Addendum)
Spoke to [place]- daughter.Updated on plan of care and discharge instructions.   Avon Gully, MD also updated daughter.   PTAR called to transport patient home. Daughter does not have portable oxygen for pt.

## 2021-01-23 NOTE — ED Notes (Signed)
Pt c/o knee pain. Avon Gully, MD made aware. Patient given Prn pain med at [phone]. Pt requesting more pain medication. Knee pain is chronic per chart and takes percocet at home with relief of pain usually.

## 2021-01-23 NOTE — ED Notes (Signed)
Patient transported to X-ray 

## 2021-01-24 ENCOUNTER — Telehealth (HOSPITAL_BASED_OUTPATIENT_CLINIC_OR_DEPARTMENT_OTHER): Payer: Self-pay | Admitting: Emergency Medicine

## 2021-01-24 ENCOUNTER — Encounter (HOSPITAL_COMMUNITY): Payer: Self-pay | Admitting: *Deleted

## 2021-01-24 ENCOUNTER — Emergency Department (HOSPITAL_COMMUNITY)
Admission: EM | Admit: 2021-01-24 | Discharge: 2021-01-24 | Disposition: A | Discharge status: Home / Self Care | Payer: Medicare Other | Attending: Emergency Medicine | Admitting: Emergency Medicine

## 2021-01-24 ENCOUNTER — Emergency Department (HOSPITAL_COMMUNITY): Payer: Medicare Other

## 2021-01-24 ENCOUNTER — Other Ambulatory Visit: Payer: Self-pay

## 2021-01-24 ENCOUNTER — Encounter (HOSPITAL_COMMUNITY): Payer: Self-pay | Admitting: Internal Medicine

## 2021-01-24 DIAGNOSIS — S0990XA Unspecified injury of head, initial encounter: Secondary | ICD-10-CM

## 2021-01-24 DIAGNOSIS — W19XXXA Unspecified fall, initial encounter: Secondary | ICD-10-CM

## 2021-01-24 DIAGNOSIS — M25561 Pain in right knee: Secondary | ICD-10-CM | POA: Insufficient documentation

## 2021-01-24 DIAGNOSIS — G8929 Other chronic pain: Secondary | ICD-10-CM | POA: Diagnosis not present

## 2021-01-24 DIAGNOSIS — W01198A Fall on same level from slipping, tripping and stumbling with subsequent striking against other object, initial encounter: Secondary | ICD-10-CM | POA: Diagnosis not present

## 2021-01-24 DIAGNOSIS — Z7901 Long term (current) use of anticoagulants: Secondary | ICD-10-CM | POA: Insufficient documentation

## 2021-01-24 DIAGNOSIS — S0083XA Contusion of other part of head, initial encounter: Secondary | ICD-10-CM | POA: Insufficient documentation

## 2021-01-24 DIAGNOSIS — M25562 Pain in left knee: Secondary | ICD-10-CM | POA: Diagnosis not present

## 2021-01-24 LAB — BLOOD CULTURE ID PANEL (REFLEXED) - BCID2

## 2021-01-24 LAB — URINE CULTURE

## 2021-01-24 MED ORDER — HYDROCODONE-ACETAMINOPHEN 5-325 MG PO TABS
1.0000 | ORAL_TABLET | Freq: Once | ORAL | Status: AC
  Administered 2021-01-24: 1 via ORAL
  Filled 2021-01-24: qty 1

## 2021-01-24 MED ORDER — LIDOCAINE 5 % EX PTCH
1.0000 | MEDICATED_PATCH | Freq: Every day | CUTANEOUS | Status: DC
  Administered 2021-01-24: 1 via TRANSDERMAL
  Filled 2021-01-24: qty 1

## 2021-01-24 NOTE — ED Notes (Signed)
PTAR cancelled

## 2021-01-24 NOTE — ED Notes (Signed)
Patient transported to CT 

## 2021-01-24 NOTE — ED Notes (Signed)
Trauma Response Nurse Note-  Reason for Call / Reason for Trauma activation:   - Level 2 trauma, fall on blood thinner  Initial Focused Assessment (If applicable, or please see trauma documentation):  - Pt has small abrasion superior to the left eye. Pt complaining of bilateral knee pain. Pt alert and resting in bed, no airway obstruction noted.   Interventions:  - CT head obtained. Pt back in room from CT at [facility] as of this note:  - Waiting on imaging results and x-rays of the knees to be completed.   Event Summary:   - Pt came in as a level 2 fall on eliquis. Pt states hitting her head. NT and TRN were unable to obtain a manual BP, suspected due to patients weight. Monitor/automatic BP obtained which was within a normal range. Primary RN then did a manual doppler Blood pressure. Pt then went to CT with TRN and then taken back to trauma bay and TRN gave prescribed pain medications (please see MAR). Pt waiting to go to x-ray.    Pt on 3lpm of oxygen via nasal canula. Pt states she uses this amount of oxygen at home.

## 2021-01-24 NOTE — ED Provider Notes (Signed)
[facility] Emergency Department Provider Note MRN:  [id number]  Arrival date & time: 01/24/21     Chief Complaint   Fall   History of Present Illness   Sheryl Aguilar is a 75 y.o. year-old female with a history of A. fib, chronic respiratory failure presenting to the ED with chief complaint of level 2 trauma.  Patient was trying to get out of bed and she fell onto the ground.  She struck her left forehead/brow on the floor.  No loss of consciousness.  Denies nausea or vomiting.  No neck pain, no back pain, no chest pain or shortness of breath, no abdominal pain.  Endorsing pain to bilateral knees which is chronic but worse since the fall.  Pain in the knees is moderate to severe, constant, worse with motion or palpation.  Review of Systems  A complete 10 system review of systems was obtained and all systems are negative except as noted in the HPI and PMH.   Patient's Health History   Past medical history: A. fib, chronic respiratory failure   Social history: Lives alone Social History   Socioeconomic History   Marital status: Single    Spouse name: Not on file   Number of children: Not on file   Years of education: Not on file   Highest education level: Not on file  Occupational History   Not on file  Tobacco Use   Smoking status: Never   Smokeless tobacco: Never  Substance and Sexual Activity   Alcohol use: Not Currently   Drug use: Not Currently   Sexual activity: Not on file  Other Topics Concern   Not on file  Social History Narrative   Not on file   Social Determinants of Health   Financial Resource Strain: Not on file  Food Insecurity: Not on file  Transportation Needs: Not on file  Physical Activity: Not on file  Stress: Not on file  Social Connections: Not on file  Intimate Partner Violence: Not on file     Physical Exam   Vitals:   01/24/21 0053 01/24/21 0100  BP: (!) 114/58   Pulse:  (!) 106  Resp:  (!) 22  Temp:     SpO2:  96%    CONSTITUTIONAL: Chronically ill-appearing, NAD NEURO:  Alert and oriented x 3, no focal deficits EYES:  eyes equal and reactive ENT/NECK:  no LAD, no JVD CARDIO: Regular rate, well-perfused, normal S1 and S2 PULM:  CTAB no wheezing or rhonchi GI/GU:  normal bowel sounds, non-distended, non-tender MSK/SPINE:  No gross deformities, no edema, normal range of motion of the neck with no C, T, or L spinal tenderness SKIN: Mild bruising to the left brow PSYCH:  Appropriate speech and behavior  *Additional and/or pertinent findings included in MDM below  Diagnostic and Interventional Summary    EKG Interpretation  Date/Time:    Ventricular Rate:    PR Interval:    QRS Duration:   QT Interval:    QTC Calculation:   R Axis:     Text Interpretation:         Labs Reviewed - No data to display  DG Knee Complete 4 Views Right  Final Result    DG Knee Complete 4 Views Left  Final Result    CT HEAD WO CONTRAST (5MM)  Final Result      Medications  lidocaine (LIDODERM) 5 % 1 patch (has no administration in time range)  HYDROcodone-acetaminophen (NORCO/VICODIN) 5-325 MG per tablet  1 tablet (1 tablet Oral Given 01/24/21 0106)     Procedures  /  Critical Care Procedures  ED Course and Medical Decision Making  I have reviewed the triage vital signs, the nursing notes, and pertinent available records from the EMR.  Listed above are laboratory and imaging tests that I personally ordered, reviewed, and interpreted and then considered in my medical decision making (see below for details).  Overall a very reassuring exam, minimal bruising to the forehead/brow.  Given the anticoagulation will obtain CT head.  No indication for neck imaging at this time based on the very reassuring exam.  Having some bilateral knee pain which seems mostly chronic but worse since the fall, will obtain screening x-rays.  Otherwise a benign abdomen, reassuring vital signs, well-perfused,  awaiting imaging.     Imaging reassuring, patient continues to look and feel well with normal vital signs, appropriate for discharge.  [name]. [name], MD [place] [email or web address]  Final Clinical Impressions(s) / ED Diagnoses     ICD-10-CM   1. Fall, initial encounter  W19.XXXA     2. Traumatic injury of head, initial encounter  S09.90XA     3. Chronic pain of both knees  M25.561    M25.562    G89.29     4. Anticoagulated  Z79.01       ED Discharge Orders     None        Discharge Instructions Discussed with and Provided to Patient:     Discharge Instructions      You were evaluated in the Emergency Department and after careful evaluation, we did not find any emergent condition requiring admission or further testing in the hospital.  Your exam/testing today was overall reassuring.  X-rays and CT scans did not show any significant injuries.  Please return to the Emergency Department if you experience any worsening of your condition.  Thank you for allowing [place] to be a part of your care.         Maudie Flakes, MD 01/24/21 [phone]

## 2021-01-24 NOTE — Discharge Instructions (Addendum)
You were evaluated in the Emergency Department and after careful evaluation, we did not find any emergent condition requiring admission or further testing in the hospital.  Your exam/testing today was overall reassuring.  X-rays and CT scans did not show any significant injuries.  Please return to the Emergency Department if you experience any worsening of your condition.  Thank you for allowing [place] to be a part of your care.

## 2021-01-24 NOTE — ED Triage Notes (Signed)
Pt was just d/c'd from [facility] for fall on blood thinners.  She was trying to get into bed and she slipped and fell, hitting her L forehead on the nightstand.  No loc.

## 2021-01-26 LAB — CULTURE, BLOOD (SINGLE)

## 2021-01-27 ENCOUNTER — Telehealth: Payer: Self-pay | Admitting: Emergency Medicine

## 2021-01-27 NOTE — Telephone Encounter (Signed)
Post ED Visit - Positive Culture Follow-up: Unsuccessful Patient Follow-up  Culture assessed and recommendations reviewed by:  []  Elenor Quinones, Pharm.D. []  Heide Guile, Pharm.D., BCPS AQ-ID []  Parks Neptune, Pharm.D., BCPS []  Alycia Rossetti, Pharm.D., BCPS []  [place], Pharm.D., BCPS, AAHIVP []  Legrand Como, Pharm.D., BCPS, AAHIVP [x]  Ulice Dash, PharmD []  Vincenza Hews, PharmD, BCPS  Positive Blood culture  []  Patient discharged without antimicrobial prescription and treatment is now indicated []  Organism is resistant to prescribed ED discharge antimicrobial []  Patient with positive blood cultures   Unable to contact patient at phone number on file, letter will be sent to address on file  Plan: Call and instruct patient to return to the ED - Calvert Cantor, MD  Milus Mallick 01/27/2021, 5:20 PM

## 2021-01-27 NOTE — Progress Notes (Signed)
ED Antimicrobial Stewardship Positive Culture Follow Up   Sheryl Aguilar is an 75 y.o. female who presented to [facility] on 01/24/2021 with a chief complaint of fall discharged on Keflex for UTI 10/5. Blood culture grew Klebsiella pneumoniae pan-sensitive except for ampicillin.   Chief Complaint  Patient presents with   Fall    Recent Results (from the past 720 hour(s))  Blood culture (routine single)     Status: Abnormal   Collection Time: 01/23/21 12:00 AM   Specimen: BLOOD  Result Value Ref Range Status   Specimen Description   Final    BLOOD RIGHT ANTECUBITAL Performed at [facility] [address], [place], [place] [address]    Special Requests   Final    BOTTLES DRAWN AEROBIC AND ANAEROBIC Blood Culture results may not be optimal due to an excessive volume of blood received in culture bottles Performed at [facility] [address], [place], [place] [address]    Culture  Setup Time   Final    GRAM NEGATIVE RODS IN BOTH AEROBIC AND ANAEROBIC BOTTLES CRITICAL RESULT CALLED TO, READ BACK BY AND VERIFIED WITH: RN LISA ADKINS 01/24/21@00 :05 BY TW Performed at [facility], [place] [address] [place], [place] [address]    Culture KLEBSIELLA PNEUMONIAE (A)  Final   Report Status 01/26/2021 FINAL  Final   Organism ID, Bacteria KLEBSIELLA PNEUMONIAE  Final      Susceptibility   Klebsiella pneumoniae - MIC*    AMPICILLIN RESISTANT Resistant     CEFAZOLIN <=4 SENSITIVE Sensitive     CEFEPIME <=0.12 SENSITIVE Sensitive     CEFTAZIDIME <=1 SENSITIVE Sensitive     CEFTRIAXONE <=0.25 SENSITIVE Sensitive     CIPROFLOXACIN <=0.25 SENSITIVE Sensitive     GENTAMICIN <=1 SENSITIVE Sensitive     IMIPENEM <=0.25 SENSITIVE Sensitive     TRIMETH/SULFA <=20 SENSITIVE Sensitive     AMPICILLIN/SULBACTAM 4 SENSITIVE Sensitive     PIP/TAZO <=4 SENSITIVE Sensitive     * KLEBSIELLA PNEUMONIAE  Blood Culture ID Panel (Reflexed)     Status: Abnormal    Collection Time: 01/23/21 12:00 AM  Result Value Ref Range Status   Enterococcus faecalis NOT DETECTED NOT DETECTED Final   Enterococcus Faecium NOT DETECTED NOT DETECTED Final   Listeria monocytogenes NOT DETECTED NOT DETECTED Final   Staphylococcus species NOT DETECTED NOT DETECTED Final   Staphylococcus aureus (BCID) NOT DETECTED NOT DETECTED Final   Staphylococcus epidermidis NOT DETECTED NOT DETECTED Final   Staphylococcus lugdunensis NOT DETECTED NOT DETECTED Final   Streptococcus species NOT DETECTED NOT DETECTED Final   Streptococcus agalactiae NOT DETECTED NOT DETECTED Final   Streptococcus pneumoniae NOT DETECTED NOT DETECTED Final   Streptococcus pyogenes NOT DETECTED NOT DETECTED Final   A.calcoaceticus-baumannii NOT DETECTED NOT DETECTED Final   Bacteroides fragilis NOT DETECTED NOT DETECTED Final   Enterobacterales DETECTED (A) NOT DETECTED Final    Comment: Enterobacterales represent a large order of gram negative bacteria, not a single organism. CRITICAL RESULT CALLED TO, READ BACK BY AND VERIFIED WITH: RN LISA ADKINS 01/24/21@00 :05 BY TW    Enterobacter cloacae complex NOT DETECTED NOT DETECTED Final   Escherichia coli NOT DETECTED NOT DETECTED Final   Klebsiella aerogenes NOT DETECTED NOT DETECTED Final   Klebsiella oxytoca NOT DETECTED NOT DETECTED Final   Klebsiella pneumoniae DETECTED (A) NOT DETECTED Final    Comment: CRITICAL RESULT CALLED TO, READ BACK BY AND VERIFIED WITH: RN LISA ADKINS 01/24/21@00 :05 BY TW  Proteus species NOT DETECTED NOT DETECTED Final   Salmonella species NOT DETECTED NOT DETECTED Final   Serratia marcescens NOT DETECTED NOT DETECTED Final   Haemophilus influenzae NOT DETECTED NOT DETECTED Final   Neisseria meningitidis NOT DETECTED NOT DETECTED Final   Pseudomonas aeruginosa NOT DETECTED NOT DETECTED Final   Stenotrophomonas maltophilia NOT DETECTED NOT DETECTED Final   Candida albicans NOT DETECTED NOT DETECTED Final   Candida auris  NOT DETECTED NOT DETECTED Final   Candida glabrata NOT DETECTED NOT DETECTED Final   Candida krusei NOT DETECTED NOT DETECTED Final   Candida parapsilosis NOT DETECTED NOT DETECTED Final   Candida tropicalis NOT DETECTED NOT DETECTED Final   Cryptococcus neoformans/gattii NOT DETECTED NOT DETECTED Final   CTX-M ESBL NOT DETECTED NOT DETECTED Final   Carbapenem resistance IMP NOT DETECTED NOT DETECTED Final   Carbapenem resistance KPC NOT DETECTED NOT DETECTED Final   Carbapenem resistance NDM NOT DETECTED NOT DETECTED Final   Carbapenem resist OXA 48 LIKE NOT DETECTED NOT DETECTED Final   Carbapenem resistance VIM NOT DETECTED NOT DETECTED Final    Comment: Performed at [facility], [address] [place], [place] [address]  Resp Panel by RT-PCR (Flu A&B, Covid) Nasopharyngeal Swab     Status: Abnormal   Collection Time: 01/23/21 12:30 AM   Specimen: Nasopharyngeal Swab; Nasopharyngeal(NP) swabs in vial transport medium  Result Value Ref Range Status   SARS Coronavirus 2 by RT PCR POSITIVE (A) NEGATIVE Final    Comment: RESULT CALLED TO, READ BACK BY AND VERIFIED WITH: RN A BANNO AT 0139 01/23/21 CRUICKSHANK A (NOTE) SARS-CoV-2 target nucleic acids are DETECTED.  The SARS-CoV-2 RNA is generally detectable in upper respiratory specimens during the acute phase of infection. Positive results are indicative of the presence of the identified virus, but do not rule out bacterial infection or co-infection with other pathogens not detected by the test. Clinical correlation with patient history and other diagnostic information is necessary to determine patient infection status. The expected result is Negative.  Fact Sheet for Patients: EntrepreneurPulse.com.au  Fact Sheet for Healthcare Providers: IncredibleEmployment.be  This test is not yet approved or cleared by the [place] FDA and  has been authorized for detection and/or diagnosis of  SARS-CoV-2 by FDA under an Emergency Use Authorization (EUA).  This EUA will remain in effect (meaning this test  can be used) for the duration of  the COVID-19 declaration under Section 564(b)(1) of the Act, 21 U.S.C. section 360bbb-3(b)(1), unless the authorization is terminated or revoked sooner.     Influenza A by PCR NEGATIVE NEGATIVE Final   Influenza B by PCR NEGATIVE NEGATIVE Final    Comment: (NOTE) The Xpert Xpress SARS-CoV-2/FLU/RSV plus assay is intended as an aid in the diagnosis of influenza from Nasopharyngeal swab specimens and should not be used as a sole basis for treatment. Nasal washings and aspirates are unacceptable for Xpert Xpress SARS-CoV-2/FLU/RSV testing.  Fact Sheet for Patients: EntrepreneurPulse.com.au  Fact Sheet for Healthcare Providers: IncredibleEmployment.be  This test is not yet approved or cleared by the [place] FDA and has been authorized for detection and/or diagnosis of SARS-CoV-2 by FDA under an Emergency Use Authorization (EUA). This EUA will remain in effect (meaning this test can be used) for the duration of the COVID-19 declaration under Section 564(b)(1) of the Act, 21 U.S.C. section 360bbb-3(b)(1), unless the authorization is terminated or revoked.  Performed at [facility], [place] [address], [place], [place] [address]  Urine Culture     Status: Abnormal   Collection Time: 01/23/21  7:49 AM   Specimen: Urine, Clean Catch  Result Value Ref Range Status   Specimen Description   Final    URINE, CLEAN CATCH Performed at [facility], [place] [address] [place], [place] [address]    Special Requests   Final    NONE Performed at [facility], [place] [address] [place], [place] [address]    Culture MULTIPLE SPECIES PRESENT, SUGGEST RECOLLECTION (A)  Final   Report Status 01/24/2021 FINAL  Final   Note in chart from 10/6: RN was  instructed to call patient to return to ER. No subsequent documentation. Will instruct PP RN to again attempt to instruct patient to return to [facility] ED for evaluation.   ED Provider: Calvert Cantor, MD   Ulice Dash D 01/27/2021, 9:03 AM Clinical Pharmacist [phone]

## 2021-11-13 IMAGING — CT CT HEAD W/O CM
4 series · 17 of 47 positions shown, 19 images · non-contrast
Comparison: January 23, 2021

CLINICAL DATA: Status post trauma.

EXAM:
CT HEAD WITHOUT CONTRAST
TECHNIQUE: Contiguous axial images were obtained from the base of the skull
through the vertex without intravenous contrast.

[Series 3: head without · axial · non-contrast · 0.42mm/px · z∈[-74,+46]mm · 7 of 33 slices shown, 9 images]
[im 5/33  brain]
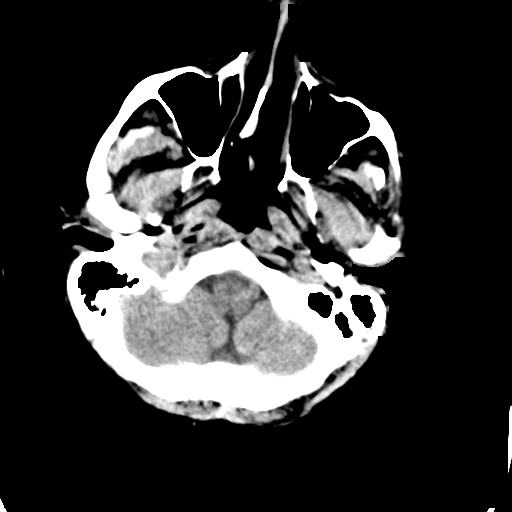
[im 5/33  bone]
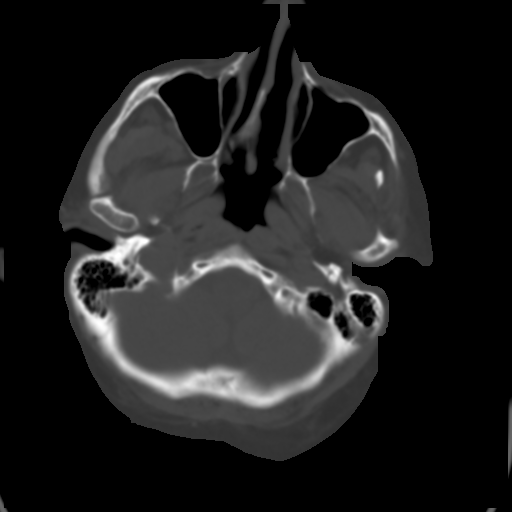
[im 9/33  brain]
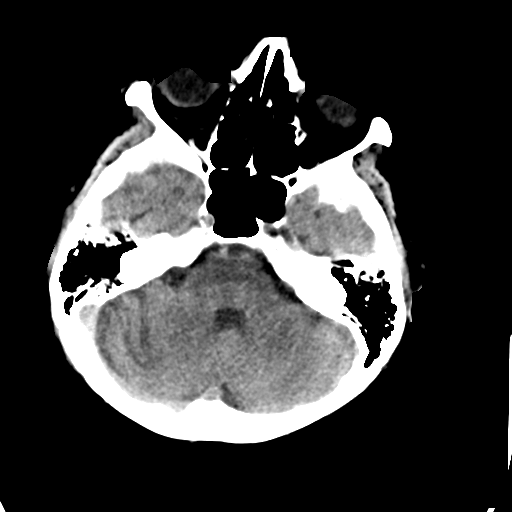
[im 13/33  brain]
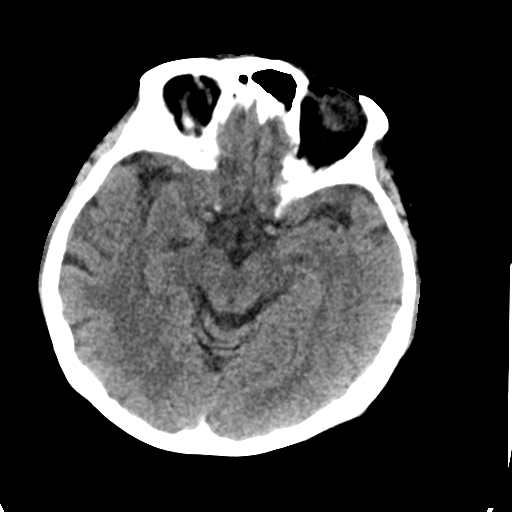
[im 17/33  brain]
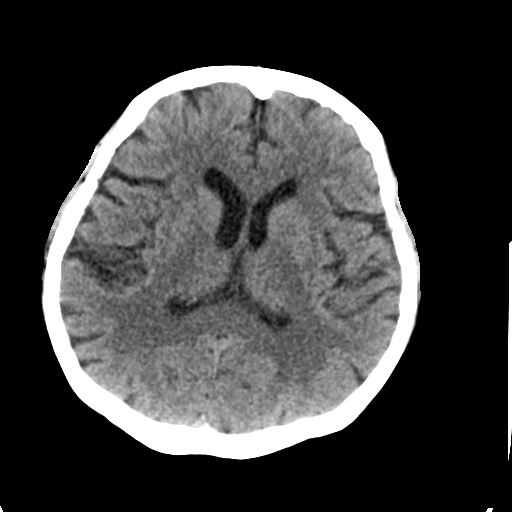
[im 21/33  brain]
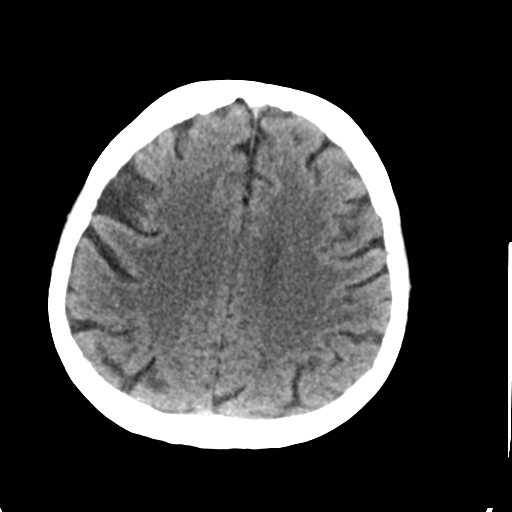
[im 21/33  bone]
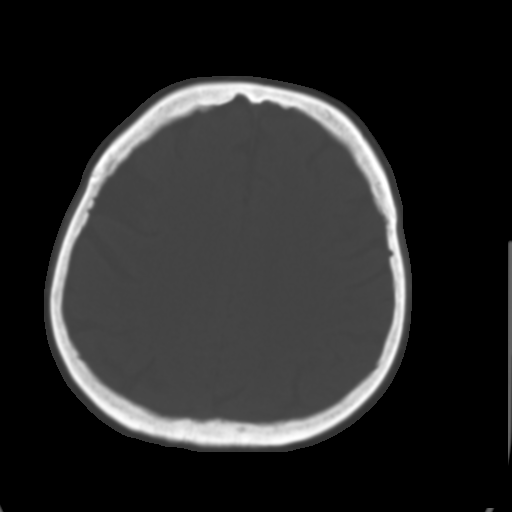
[im 25/33  brain]
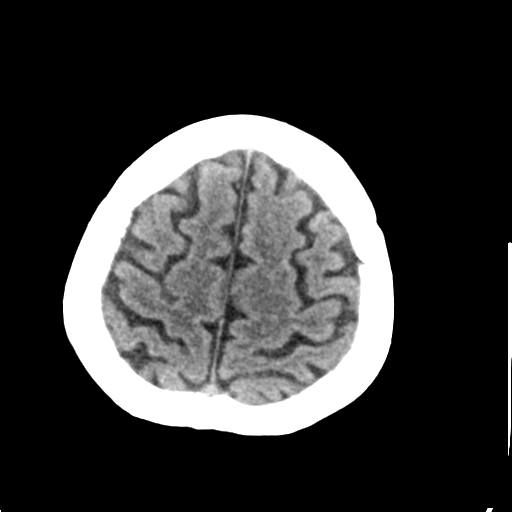
[im 29/33  brain]
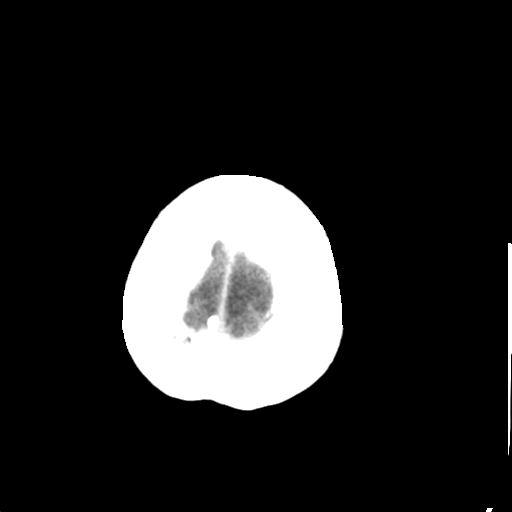

[Series 4: head bone · axial · 0.42mm/px · z∈[-78,-22]mm · 4 of 81 slices shown]
[im 9/81  bone]
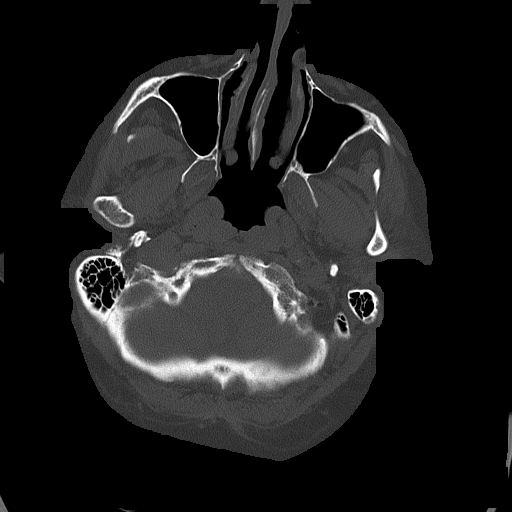
[im 17/81  bone]
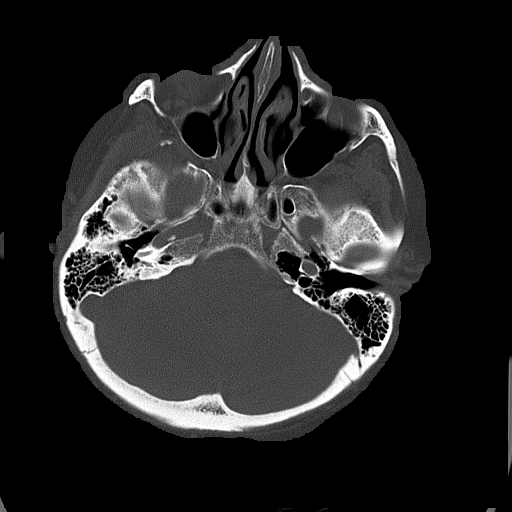
[im 25/81  bone]
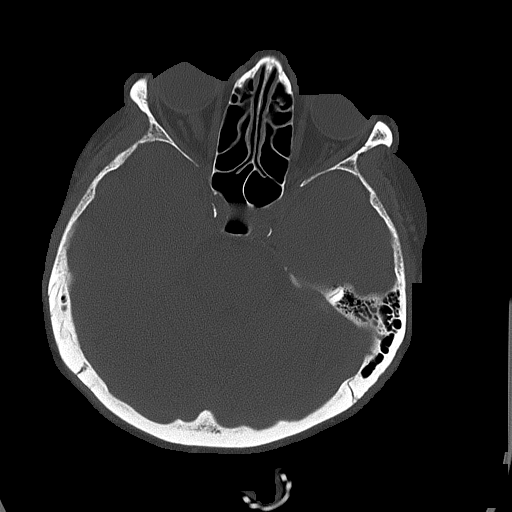
[im 37/81  bone]
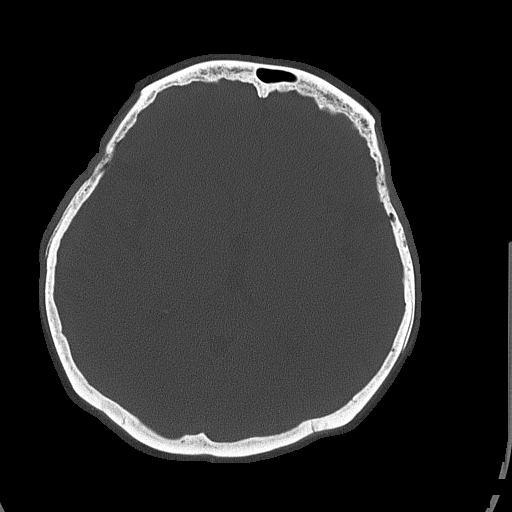

[Series 5: head without cor · coronal · non-contrast · 0.39mm/px · 3 of 54 slices shown]
[im 18/54  brain]
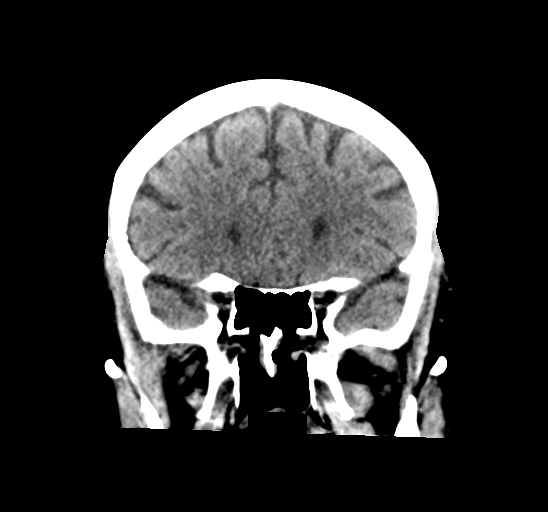
[im 24/54  brain]
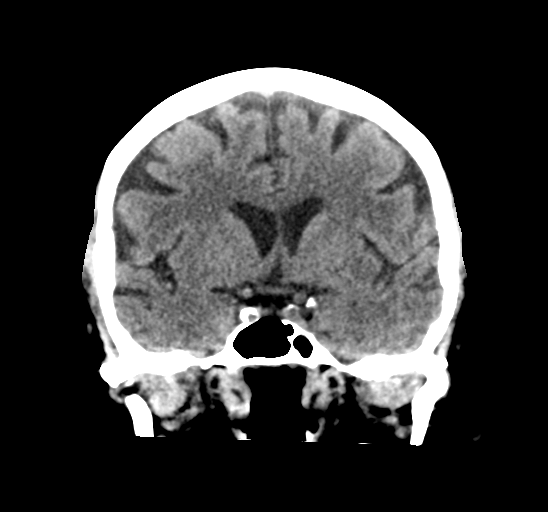
[im 30/54  brain]
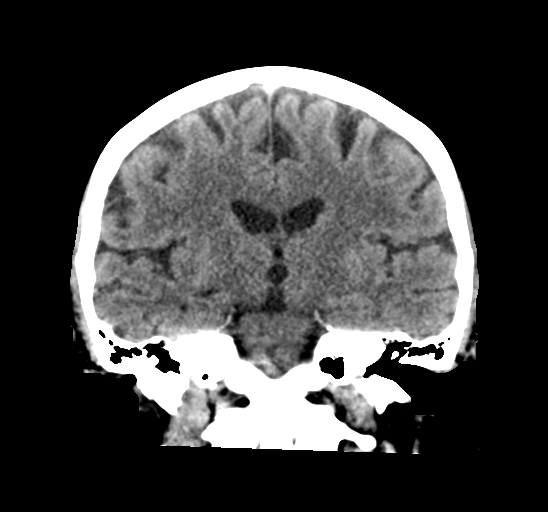

[Series 6: head without sag · sagittal · non-contrast · 0.35mm/px · 3 of 52 slices shown]
[im 18/52  brain]
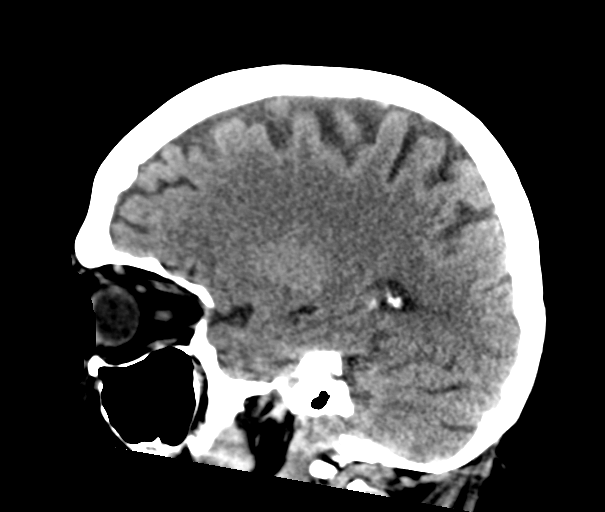
[im 26/52  brain]
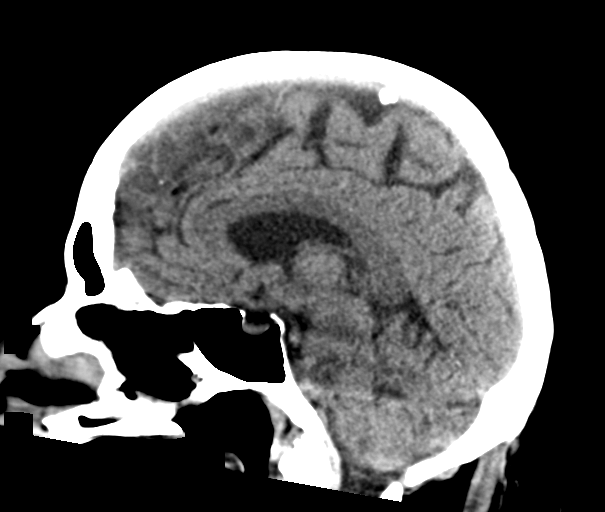
[im 35/52  brain]
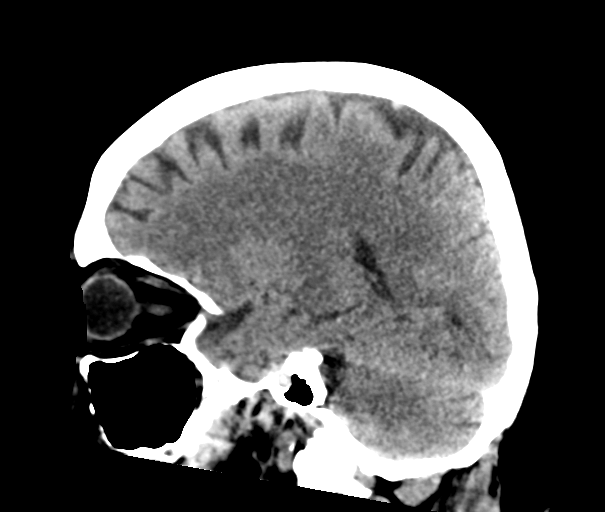

[17 of 47 positions shown; findings below may reference images not displayed]

FINDINGS: Brain: There is mild cerebral atrophy with widening of the
extra-axial spaces and ventricular dilatation.
There are areas of decreased attenuation within the white matter
tracts of the supratentorial brain, consistent with microvascular
disease changes.

Vascular: No hyperdense vessel or unexpected calcification.

Skull: Normal. Negative for fracture or focal lesion.

Sinuses/Orbits: No acute finding.

Other: None.
IMPRESSION: No acute intracranial pathology.

## 2022-01-15 ENCOUNTER — Encounter (HOSPITAL_COMMUNITY): Payer: Self-pay | Admitting: Emergency Medicine

## 2022-01-15 ENCOUNTER — Emergency Department (HOSPITAL_COMMUNITY)
Admission: EM | Admit: 2022-01-15 | Discharge: 2022-01-15 | Disposition: A | Discharge status: Home / Self Care | Payer: Medicare Other | Attending: Emergency Medicine | Admitting: Emergency Medicine

## 2022-01-15 ENCOUNTER — Emergency Department (HOSPITAL_COMMUNITY): Payer: Medicare Other

## 2022-01-15 DIAGNOSIS — E119 Type 2 diabetes mellitus without complications: Secondary | ICD-10-CM | POA: Diagnosis not present

## 2022-01-15 DIAGNOSIS — Z7901 Long term (current) use of anticoagulants: Secondary | ICD-10-CM | POA: Insufficient documentation

## 2022-01-15 DIAGNOSIS — S0990XA Unspecified injury of head, initial encounter: Secondary | ICD-10-CM | POA: Insufficient documentation

## 2022-01-15 DIAGNOSIS — W19XXXA Unspecified fall, initial encounter: Secondary | ICD-10-CM

## 2022-01-15 DIAGNOSIS — Z7984 Long term (current) use of oral hypoglycemic drugs: Secondary | ICD-10-CM | POA: Diagnosis not present

## 2022-01-15 DIAGNOSIS — W050XXA Fall from non-moving wheelchair, initial encounter: Secondary | ICD-10-CM | POA: Diagnosis not present

## 2022-01-15 DIAGNOSIS — I10 Essential (primary) hypertension: Secondary | ICD-10-CM | POA: Diagnosis not present

## 2022-01-15 LAB — CBC WITH DIFFERENTIAL/PLATELET
Abs Immature Granulocytes: 0.04 10*3/uL (ref 0.00–0.07)
Basophils Absolute: 0.1 10*3/uL (ref 0.0–0.1)
Basophils Relative: 1 %
Eosinophils Absolute: 0.1 10*3/uL (ref 0.0–0.5)
Eosinophils Relative: 1 %
HCT: 43.9 % (ref 36.0–46.0)
Hemoglobin: 14.8 g/dL (ref 12.0–15.0)
Immature Granulocytes: 1 %
Lymphocytes Relative: 18 %
Lymphs Abs: 1.5 10*3/uL (ref 0.7–4.0)
MCH: 32.2 pg (ref 26.0–34.0)
MCHC: 33.7 g/dL (ref 30.0–36.0)
MCV: 95.6 fL (ref 80.0–100.0)
Monocytes Absolute: 0.6 10*3/uL (ref 0.1–1.0)
Monocytes Relative: 7 %
Neutro Abs: 6.3 10*3/uL (ref 1.7–7.7)
Neutrophils Relative %: 72 %
Platelets: 218 10*3/uL (ref 150–400)
RBC: 4.59 MIL/uL (ref 3.87–5.11)
RDW: 13.8 % (ref 11.5–15.5)
WBC: 8.6 10*3/uL (ref 4.0–10.5)
nRBC: 0 % (ref 0.0–0.2)

## 2022-01-15 LAB — COMPREHENSIVE METABOLIC PANEL
ALT: 19 U/L (ref 0–44)
AST: 21 U/L (ref 15–41)
Albumin: 3.5 g/dL (ref 3.5–5.0)
Alkaline Phosphatase: 70 U/L (ref 38–126)
Anion gap: 8 (ref 5–15)
BUN: 19 mg/dL (ref 8–23)
CO2: 26 mmol/L (ref 22–32)
Calcium: 9.4 mg/dL (ref 8.9–10.3)
Chloride: 102 mmol/L (ref 98–111)
Creatinine, Ser: 1.47 mg/dL — ABNORMAL HIGH (ref 0.44–1.00)
GFR, Estimated: 37 mL/min — ABNORMAL LOW (ref >60)
Glucose, Bld: 185 mg/dL — ABNORMAL HIGH (ref 70–99)
Potassium: 4.4 mmol/L (ref 3.5–5.1)
Sodium: 136 mmol/L (ref 135–145)
Total Bilirubin: 1.7 mg/dL — ABNORMAL HIGH (ref 0.3–1.2)
Total Protein: 7.5 g/dL (ref 6.5–8.1)

## 2022-01-15 MED ORDER — OXYCODONE-ACETAMINOPHEN 5-325 MG PO TABS
2.0000 | ORAL_TABLET | Freq: Once | ORAL | Status: AC
  Administered 2022-01-15: 2 via ORAL
  Filled 2022-01-15: qty 2

## 2022-01-15 MED ORDER — LACTATED RINGERS IV BOLUS
500.0000 mL | Freq: Once | INTRAVENOUS | Status: AC
  Administered 2022-01-15: 500 mL via INTRAVENOUS

## 2022-01-15 NOTE — ED Notes (Signed)
[name] (Daughter) called asking for an update on Sheryl Aguilar. Her number is [phone].

## 2022-01-15 NOTE — ED Triage Notes (Signed)
Pt arrives via EMS from home with mechanical fall when trying to get into wheelchair. Pt hit head on wall and on toybox. Pt endorses headache. GCS 15. Headache has been going on for a few weeks, sees a pain clinic.

## 2022-01-15 NOTE — ED Provider Notes (Signed)
[facility] EMERGENCY DEPARTMENT Provider Note   CSN: [id number] Arrival date & time: 01/15/22  1719     History  Chief Complaint  Patient presents with   Lytle Michaels    Sheryl Aguilar is a 76 y.o. female.  HPI 76 year old female presents after a fall.  She presents as a level 2 trauma given that she uses Eliquis and hit her head. Patient was trying to transfer from her wheelchair to the chair and was an extra step away and states that her legs gave out on her.  She fell to her knees first and then hit her head.  She did not lose consciousness.  Is complaining of a little bit of left-sided maxillary pain as well as a headache.  She endorses a right-sided posterior headache for about 3 weeks.  She also feels generally weak but tells me this has been progressively worsening for 8+ months.  She has been having diarrhea for the last week or so.  No chest pain, shortness of breath, syncope or near syncope today.  She laid on the ground for about 2 hours because her daughter could not help her get up and she could get up on her own.  She is on Eliquis for A-fib but also has a history of DVT.  She also has a history of type 2 diabetes and chronic pain/arthritis.  She has a history of hypertension. Home Medications Prior to Admission medications   Medication Sig Start Date End Date Taking? Authorizing Provider  apixaban (ELIQUIS) 5 MG TABS tablet Take 5 mg by mouth 2 (two) times daily.    [provider]  carvedilol (COREG) 12.5 MG tablet Take 12.5 mg by mouth 2 (two) times daily.    [provider]  cholecalciferol (VITAMIN D) 25 MCG (1000 UNIT) tablet Take 1,000 Units by mouth daily.    [provider]  DULoxetine (CYMBALTA) 60 MG capsule Take 60 mg by mouth daily.    [provider]  furosemide (LASIX) 20 MG tablet Take 20 mg by mouth 2 (two) times daily.    [provider]  gabapentin (NEURONTIN) 300 MG capsule Take 300 mg by mouth 2  (two) times daily. May take 1 additional capsule at bedtime as needed for sleep    [provider]  metFORMIN (GLUCOPHAGE) 1000 MG tablet Take 1,000 mg by mouth 2 (two) times daily with a meal.    [provider]  oxyCODONE-acetaminophen (PERCOCET) 7.5-325 MG tablet Take 1 tablet by mouth every 4 (four) hours as needed for severe pain.    [provider]  Semaglutide,0.25 or 0.'5MG'$ /DOS, 2 MG/1.5ML SOPN Inject 0.5 mg into the skin See admin instructions. Inject 71m into the skin once weekly every Friday 05/01/20   [provider]      Allergies    Filgrastim and Sargramostim    Review of Systems   Review of Systems  Musculoskeletal:  Positive for arthralgias (chronic arthritis pain, no new injuries today) and neck pain (chronic).  Neurological:  Positive for weakness and headaches. Negative for numbness.    Physical Exam Updated Vital Signs BP 132/76   Pulse 91   Temp 98 F (36.7 C) (Oral)   Resp 18   Ht '5\' 5"'$  (1.651 m)   Wt (!) 138 kg   SpO2 94%   BMI 50.63 kg/m  Physical Exam Vitals and nursing note reviewed.  Constitutional:      Appearance: She is well-developed. She is obese.  HENT:  Head: Normocephalic.     Comments: Mild left cheek tenderness but no swelling or ecchymosis.  No obvious head injury. Eyes:     Extraocular Movements: Extraocular movements intact.     Pupils: Pupils are equal, round, and reactive to light.  Cardiovascular:     Rate and Rhythm: Normal rate and regular rhythm.     Heart sounds: Normal heart sounds.  Pulmonary:     Effort: Pulmonary effort is normal.     Breath sounds: Normal breath sounds.  Abdominal:     Palpations: Abdomen is soft.     Tenderness: There is no abdominal tenderness.  Musculoskeletal:     Comments: No decreased range of motion to the hips.  No obvious extremity trauma besides a small abrasion to her right forearm.  Skin:    General: Skin is warm and dry.  Neurological:     Mental  Status: She is alert.     Comments: Patient is awake and alert, oriented, and has no slurred speech.  No facial droop.  Equal strength in all 4 extremities.     ED Results / Procedures / Treatments   Labs (all labs ordered are listed, but only abnormal results are displayed) Labs Reviewed  COMPREHENSIVE METABOLIC PANEL - Abnormal; Notable for the following components:      Result Value   Glucose, Bld 185 (*)    Creatinine, Ser 1.47 (*)    Total Bilirubin 1.7 (*)    GFR, Estimated 37 (*)    All other components within normal limits  CBC WITH DIFFERENTIAL/PLATELET    EKG EKG Interpretation  Date/Time:  Wednesday January 15 2022 17:31:54 EDT Ventricular Rate:  92 PR Interval:    QRS Duration: 93 QT Interval:  395 QTC Calculation: 489 R Axis:   91 Text Interpretation: Atrial fibrillation Right axis deviation Borderline repolarization abnormality Borderline prolonged QT interval Confirmed by Sherwood Gambler [phone]) on 01/15/2022 5:51:40 PM  Radiology CT Maxillofacial Wo Contrast  Result Date: 01/15/2022 CLINICAL DATA:  Status post fall. EXAM: CT MAXILLOFACIAL WITHOUT CONTRAST TECHNIQUE: Multidetector CT imaging of the maxillofacial structures was performed. Multiplanar CT image reconstructions were also generated. RADIATION DOSE REDUCTION: This exam was performed according to the departmental dose-optimization program which includes automated exposure control, adjustment of the mA and/or kV according to patient size and/or use of iterative reconstruction technique. COMPARISON:  None Available. FINDINGS: Osseous: No fracture or mandibular dislocation. No destructive process. Orbits: Negative. No traumatic or inflammatory finding. Sinuses: Clear. Soft tissues: Negative. Limited intracranial: No significant or unexpected finding. IMPRESSION: No acute facial bone fracture. Electronically Signed   By: Virgina Norfolk M.D.   On: 01/15/2022 18:44   CT Cervical Spine Wo Contrast  Result  Date: 01/15/2022 CLINICAL DATA:  Status post fall. EXAM: CT CERVICAL SPINE WITHOUT CONTRAST TECHNIQUE: Multidetector CT imaging of the cervical spine was performed without intravenous contrast. Multiplanar CT image reconstructions were also generated. RADIATION DOSE REDUCTION: This exam was performed according to the departmental dose-optimization program which includes automated exposure control, adjustment of the mA and/or kV according to patient size and/or use of iterative reconstruction technique. COMPARISON:  None Available. FINDINGS: Alignment: There is straightening of the normal cervical spine lordosis. Skull base and vertebrae: No acute fracture. No primary bone lesion or focal pathologic process. Soft tissues and spinal canal: No prevertebral fluid or swelling. No visible canal hematoma. Disc levels: Marked severity intervertebral disc space narrowing, mild anterior osteophyte formation and mild posterior bony spurring are seen at the levels  of C5-C6 and C6-C7. There is marked severity narrowing of the anterior atlantoaxial articulation. Mild intervertebral disc space narrowing is present at C2-C3. Marked severity intervertebral disc space narrowing is seen at C5-C6 and C6-C7. Bilateral mild-to-moderate severity multilevel facet joint hypertrophy is noted. Upper chest: Negative. Other: None. IMPRESSION: 1. No acute fracture or subluxation in the cervical spine. 2. Marked severity degenerative changes at the levels of C5-C6 and C6-C7. Electronically Signed   By: Virgina Norfolk M.D.   On: 01/15/2022 18:43   CT Head Wo Contrast  Result Date: 01/15/2022 CLINICAL DATA:  Status post fall. EXAM: CT HEAD WITHOUT CONTRAST TECHNIQUE: Contiguous axial images were obtained from the base of the skull through the vertex without intravenous contrast. RADIATION DOSE REDUCTION: This exam was performed according to the departmental dose-optimization program which includes automated exposure control, adjustment of  the mA and/or kV according to patient size and/or use of iterative reconstruction technique. COMPARISON:  None Available. FINDINGS: Brain: There is mild cerebral atrophy with widening of the extra-axial spaces and ventricular dilatation. There are areas of decreased attenuation within the white matter tracts of the supratentorial brain, consistent with microvascular disease changes. Vascular: No hyperdense vessel or unexpected calcification. Skull: Normal. Negative for fracture or focal lesion. Sinuses/Orbits: No acute finding. Other: None. IMPRESSION: 1. No acute intracranial abnormality. 2. Generalized cerebral atrophy and microvascular disease changes of the supratentorial brain. Electronically Signed   By: Virgina Norfolk M.D.   On: 01/15/2022 18:41    Procedures Procedures    Medications Ordered in ED Medications  oxyCODONE-acetaminophen (PERCOCET/ROXICET) 5-325 MG per tablet 2 tablet (2 tablets Oral Given 01/15/22 1737)  lactated ringers bolus 500 mL (500 mLs Intravenous New Bag/Given 01/15/22 1856)    ED Course/ Medical Decision Making/ A&P                           Medical Decision Making Amount and/or Complexity of Data Reviewed External Data Reviewed: labs.    Details: Cr 1.24 in Aug 2023 Labs: ordered.    Details: Cr mildly bumped at 1.47.  Radiology: ordered and independent interpretation performed.    Details: No fractures or head bleed ECG/medicine tests: independent interpretation performed.    Details: Afib  Risk Prescription drug management.   Presents after what sounds like a fall from her leg giving out on her.  This is a chronic issue for her.  Minor head injury but work-up is unrevealing including negative head, C-spine, and facial CTs.  Labs were obtained given her generalized weakness but no emergent findings besides a slight bump in her creatinine.  I think the bilirubin elevation is probably from mild hemolysis.  Otherwise, she feels well enough for discharge  and was given her home dose of Percocet.  Will discharge home with return precautions.        Final Clinical Impression(s) / ED Diagnoses Final diagnoses:  Fall, initial encounter  Minor head injury, initial encounter    Rx / DC Orders ED Discharge Orders     None         Sherwood Gambler, MD 01/15/22 [phone]

## 2022-01-15 NOTE — ED Notes (Signed)
Patient transported to CT 

## 2022-01-15 NOTE — Discharge Instructions (Signed)
If you develop continued, recurrent, or worsening headache, fever, neck stiffness, vomiting, blurry or double vision, weakness or numbness in your arms or legs, trouble speaking, or any other new/concerning symptoms then return to the ER for evaluation.  

## 2022-01-15 NOTE — Progress Notes (Signed)
Orthopedic Tech Progress Note Patient Details:  Sheryl Aguilar [birth year] [id number] Level 2 Trauma  Patient ID: Sheryl Aguilar, female   DOB: April 19, 1946, 76 y.o.   MRN: [id number]  Sheryl Aguilar 01/15/2022, 5:26 PM

## 2022-10-29 ENCOUNTER — Other Ambulatory Visit: Payer: Self-pay

## 2022-10-29 ENCOUNTER — Encounter (HOSPITAL_COMMUNITY): Payer: Self-pay

## 2022-10-29 ENCOUNTER — Inpatient Hospital Stay (HOSPITAL_COMMUNITY)
Admission: EM | Admit: 2022-10-29 | Discharge: 2022-11-10 | DRG: 699 | Disposition: A | Discharge status: Skilled Nursing Facility | Payer: 59 | Attending: Internal Medicine | Admitting: Internal Medicine

## 2022-10-29 ENCOUNTER — Emergency Department (HOSPITAL_COMMUNITY): Payer: 59

## 2022-10-29 DIAGNOSIS — Z86718 Personal history of other venous thrombosis and embolism: Secondary | ICD-10-CM

## 2022-10-29 DIAGNOSIS — T83518A Infection and inflammatory reaction due to other urinary catheter, initial encounter: Principal | ICD-10-CM | POA: Diagnosis present

## 2022-10-29 DIAGNOSIS — R531 Weakness: Secondary | ICD-10-CM | POA: Diagnosis not present

## 2022-10-29 DIAGNOSIS — E1142 Type 2 diabetes mellitus with diabetic polyneuropathy: Secondary | ICD-10-CM | POA: Diagnosis present

## 2022-10-29 DIAGNOSIS — Z751 Person awaiting admission to adequate facility elsewhere: Secondary | ICD-10-CM

## 2022-10-29 DIAGNOSIS — E1165 Type 2 diabetes mellitus with hyperglycemia: Secondary | ICD-10-CM | POA: Diagnosis present

## 2022-10-29 DIAGNOSIS — Z6841 Body Mass Index (BMI) 40.0 and over, adult: Secondary | ICD-10-CM

## 2022-10-29 DIAGNOSIS — Z79899 Other long term (current) drug therapy: Secondary | ICD-10-CM

## 2022-10-29 DIAGNOSIS — E1122 Type 2 diabetes mellitus with diabetic chronic kidney disease: Secondary | ICD-10-CM | POA: Diagnosis present

## 2022-10-29 DIAGNOSIS — I4821 Permanent atrial fibrillation: Secondary | ICD-10-CM | POA: Diagnosis present

## 2022-10-29 DIAGNOSIS — Z825 Family history of asthma and other chronic lower respiratory diseases: Secondary | ICD-10-CM

## 2022-10-29 DIAGNOSIS — J810 Acute pulmonary edema: Secondary | ICD-10-CM

## 2022-10-29 DIAGNOSIS — Z823 Family history of stroke: Secondary | ICD-10-CM

## 2022-10-29 DIAGNOSIS — I4891 Unspecified atrial fibrillation: Secondary | ICD-10-CM | POA: Diagnosis present

## 2022-10-29 DIAGNOSIS — B961 Klebsiella pneumoniae [K. pneumoniae] as the cause of diseases classified elsewhere: Secondary | ICD-10-CM | POA: Diagnosis present

## 2022-10-29 DIAGNOSIS — E662 Morbid (severe) obesity with alveolar hypoventilation: Secondary | ICD-10-CM | POA: Diagnosis present

## 2022-10-29 DIAGNOSIS — I130 Hypertensive heart and chronic kidney disease with heart failure and stage 1 through stage 4 chronic kidney disease, or unspecified chronic kidney disease: Secondary | ICD-10-CM | POA: Diagnosis present

## 2022-10-29 DIAGNOSIS — J9611 Chronic respiratory failure with hypoxia: Secondary | ICD-10-CM | POA: Diagnosis present

## 2022-10-29 DIAGNOSIS — Z7901 Long term (current) use of anticoagulants: Secondary | ICD-10-CM

## 2022-10-29 DIAGNOSIS — G4733 Obstructive sleep apnea (adult) (pediatric): Secondary | ICD-10-CM | POA: Insufficient documentation

## 2022-10-29 DIAGNOSIS — J441 Chronic obstructive pulmonary disease with (acute) exacerbation: Secondary | ICD-10-CM | POA: Diagnosis present

## 2022-10-29 DIAGNOSIS — Z833 Family history of diabetes mellitus: Secondary | ICD-10-CM

## 2022-10-29 DIAGNOSIS — N1832 Chronic kidney disease, stage 3b: Secondary | ICD-10-CM | POA: Diagnosis present

## 2022-10-29 DIAGNOSIS — Z8249 Family history of ischemic heart disease and other diseases of the circulatory system: Secondary | ICD-10-CM

## 2022-10-29 DIAGNOSIS — R0602 Shortness of breath: Principal | ICD-10-CM

## 2022-10-29 DIAGNOSIS — N390 Urinary tract infection, site not specified: Secondary | ICD-10-CM | POA: Diagnosis present

## 2022-10-29 DIAGNOSIS — E785 Hyperlipidemia, unspecified: Secondary | ICD-10-CM | POA: Diagnosis present

## 2022-10-29 DIAGNOSIS — F32A Depression, unspecified: Secondary | ICD-10-CM | POA: Diagnosis present

## 2022-10-29 DIAGNOSIS — F419 Anxiety disorder, unspecified: Secondary | ICD-10-CM | POA: Diagnosis present

## 2022-10-29 DIAGNOSIS — J449 Chronic obstructive pulmonary disease, unspecified: Secondary | ICD-10-CM | POA: Insufficient documentation

## 2022-10-29 DIAGNOSIS — E119 Type 2 diabetes mellitus without complications: Secondary | ICD-10-CM

## 2022-10-29 DIAGNOSIS — Z853 Personal history of malignant neoplasm of breast: Secondary | ICD-10-CM

## 2022-10-29 DIAGNOSIS — Y846 Urinary catheterization as the cause of abnormal reaction of the patient, or of later complication, without mention of misadventure at the time of the procedure: Secondary | ICD-10-CM | POA: Diagnosis present

## 2022-10-29 DIAGNOSIS — Z888 Allergy status to other drugs, medicaments and biological substances status: Secondary | ICD-10-CM

## 2022-10-29 DIAGNOSIS — Z66 Do not resuscitate: Secondary | ICD-10-CM | POA: Diagnosis present

## 2022-10-29 DIAGNOSIS — I5022 Chronic systolic (congestive) heart failure: Secondary | ICD-10-CM | POA: Diagnosis present

## 2022-10-29 LAB — CBC WITH DIFFERENTIAL/PLATELET
Abs Immature Granulocytes: 0.06 10*3/uL (ref 0.00–0.07)
Basophils Absolute: 0 10*3/uL (ref 0.0–0.1)
Basophils Relative: 1 %
Eosinophils Absolute: 0.2 10*3/uL (ref 0.0–0.5)
Eosinophils Relative: 3 %
HCT: 29 % — ABNORMAL LOW (ref 36.0–46.0)
Hemoglobin: 8.7 g/dL — ABNORMAL LOW (ref 12.0–15.0)
Immature Granulocytes: 1 %
Lymphocytes Relative: 17 %
Lymphs Abs: 0.9 10*3/uL (ref 0.7–4.0)
MCH: 30.1 pg (ref 26.0–34.0)
MCHC: 30 g/dL (ref 30.0–36.0)
MCV: 100.3 fL — ABNORMAL HIGH (ref 80.0–100.0)
Monocytes Absolute: 0.4 10*3/uL (ref 0.1–1.0)
Monocytes Relative: 9 %
Neutro Abs: 3.6 10*3/uL (ref 1.7–7.7)
Neutrophils Relative %: 69 %
Platelets: 180 10*3/uL (ref 150–400)
RBC: 2.89 MIL/uL — ABNORMAL LOW (ref 3.87–5.11)
RDW: 18.8 % — ABNORMAL HIGH (ref 11.5–15.5)
WBC: 5.2 10*3/uL (ref 4.0–10.5)
nRBC: 0 % (ref 0.0–0.2)

## 2022-10-29 MED ORDER — IPRATROPIUM-ALBUTEROL 0.5-2.5 (3) MG/3ML IN SOLN
3.0000 mL | Freq: Once | RESPIRATORY_TRACT | Status: AC
  Administered 2022-10-29: 3 mL via RESPIRATORY_TRACT
  Filled 2022-10-29: qty 3

## 2022-10-29 MED ORDER — METHYLPREDNISOLONE SODIUM SUCC 125 MG IJ SOLR
125.0000 mg | Freq: Two times a day (BID) | INTRAMUSCULAR | Status: AC
  Administered 2022-10-29: 125 mg via INTRAVENOUS
  Filled 2022-10-29: qty 2

## 2022-10-29 MED ORDER — ONDANSETRON HCL 4 MG/2ML IJ SOLN
4.0000 mg | Freq: Once | INTRAMUSCULAR | Status: AC
  Administered 2022-10-29: 4 mg via INTRAVENOUS
  Filled 2022-10-29: qty 2

## 2022-10-29 NOTE — ED Notes (Signed)
[phone] Sheryl Aguilar pt daughter called for a update

## 2022-10-29 NOTE — ED Provider Notes (Signed)
[place] EMERGENCY DEPARTMENT AT [facility] Provider Note   CSN: [id number] Arrival date & time: 10/29/22  2146    History  Chief Complaint  Patient presents with   Shortness of Breath   Sheryl Aguilar is a 77 y.o. female.  Patient is a 77 year old woman with history significant for COPD on 3L at home, brought to the ED with shortness of breath. Patient was discharged from the hospital yesterday after being treated for pneumonia. Today she noticed shortness of breath on exertion, some chest pain radiating to the left shoulder, and nausea without vomiting. Also notes dysuria. Abdomen is tender to palpation.    Shortness of Breath Associated symptoms: chest pain   Associated symptoms: no abdominal pain, no fever, no vomiting and no wheezing      Home Medications Prior to Admission medications   Medication Sig Start Date End Date Taking? Authorizing Provider  apixaban (ELIQUIS) 5 MG TABS tablet Take 5 mg by mouth 2 (two) times daily.    [provider]  carvedilol (COREG) 12.5 MG tablet Take 12.5 mg by mouth 2 (two) times daily.    [provider]  cholecalciferol (VITAMIN D) 25 MCG (1000 UNIT) tablet Take 1,000 Units by mouth daily.    [provider]  DULoxetine (CYMBALTA) 60 MG capsule Take 60 mg by mouth daily.    [provider]  furosemide (LASIX) 20 MG tablet Take 20 mg by mouth 2 (two) times daily.    [provider]  gabapentin (NEURONTIN) 300 MG capsule Take 300 mg by mouth 2 (two) times daily. May take 1 additional capsule at bedtime as needed for sleep    [provider]  metFORMIN (GLUCOPHAGE) 1000 MG tablet Take 1,000 mg by mouth 2 (two) times daily with a meal.    [provider]  oxyCODONE-acetaminophen (PERCOCET) 7.5-325 MG tablet Take 1 tablet by mouth every 4 (four) hours as needed for severe pain.    [provider]  Semaglutide,0.25 or 0.5MG /DOS, 2 MG/1.5ML SOPN Inject 0.5 mg into  the skin See admin instructions. Inject 1mL into the skin once weekly every Friday 05/01/20   [provider]     Allergies    Filgrastim and Sargramostim    Review of Systems   Review of Systems  Constitutional:  Negative for fatigue and fever.  Respiratory:  Positive for shortness of breath. Negative for wheezing.   Cardiovascular:  Positive for chest pain.  Gastrointestinal:  Positive for nausea. Negative for abdominal pain and vomiting.  Genitourinary:  Positive for dysuria.   Physical Exam Updated Vital Signs BP 138/74 (BP Location: Right Arm)   Pulse 93   Temp 98.2 F (36.8 C) (Oral)   Resp 20   Ht 5\' 4"  (1.626 m)   Wt (!) 149.7 kg   SpO2 100%   BMI 56.64 kg/m  Physical Exam Cardiovascular:     Rate and Rhythm: Normal rate and regular rhythm.  Pulmonary:     Effort: Pulmonary effort is normal.    ED Results / Procedures / Treatments   Labs (all labs ordered are listed, but only abnormal results are displayed) Labs Reviewed  CBC WITH DIFFERENTIAL/PLATELET  BASIC METABOLIC PANEL  BRAIN NATRIURETIC PEPTIDE  URINALYSIS, ROUTINE W REFLEX MICROSCOPIC  TROPONIN I (HIGH SENSITIVITY)   EKG EKG Interpretation Date/Time:  Wednesday October 29 2022 21:54:24 EDT Ventricular Rate:  91 PR Interval:    QRS Duration:  97 QT Interval:  373 QTC Calculation: 459  R Axis:   103  Text Interpretation: Atrial fibrillation Right axis deviation Borderline repolarization abnormality No significant change since last tracing Confirmed by Melene Plan [phone]) on 10/29/2022 9:58:01 PM  Radiology No results found.  Procedures Procedures    Medications Ordered in ED Medications  ondansetron (ZOFRAN) injection 4 mg (has no administration in time range)  ipratropium-albuterol (DUONEB) 0.5-2.5 (3) MG/3ML nebulizer solution 3 mL (has no administration in time range)   ED Course/ Medical Decision Making/ A&P   Click here for ABCD2, HEART and other calculatorsREFRESH Note before  signing :1}                          Medical Decision Making Amount and/or Complexity of Data Reviewed Labs: ordered. Radiology: ordered.  Medical Decision Making:   Sheryl Aguilar is a 77 y.o. female who presented to the ED today with shortness of breath detailed above.    Complete initial physical exam performed, notably the patient had little wheezing.    Reviewed and confirmed nursing documentation for past medical history, family history, social history.    Initial Assessment:   With the patient's presentation of shortness of breath, most likely diagnosis is COPD exacerbation. Other diagnoses were considered including (but not limited to) CHF, mucus plugs, reactive airway disease, pneumonia, ACS. These are considered less likely due to history of present illness and physical exam findings.    Initial Plan:  Screening labs including CBC and Metabolic panel to evaluate for infectious or metabolic etiology of disease.  Urinalysis with reflex culture ordered to evaluate for UTI or relevant urologic/nephrologic pathology.  CXR to evaluate for structural/infectious intrathoracic pathology.  EKG to evaluate for cardiac pathology Objective evaluation as below reviewed   Initial Study Results:   Laboratory  All laboratory results reviewed without evidence of clinically relevant pathology.    EKG EKG was reviewed independently. Rate, rhythm, axis, intervals all examined and without medically relevant abnormality. ST segments without concerns for elevations.    Radiology:  All images reviewed independently. Agree with radiology report at this time.   No results found.  Reassessment and Plan:   Patient is a 77 year old woman with history of COPD and recent admission for treatment of pneumonia, presenting with shortness of breath. On exam, patient had few wheezes in bilateral lung fields.   Care was transitioned to Dr. Erin Hearing in the ED        Final Clinical Impression(s) / ED  Diagnoses Final diagnoses:  Shortness of breath    Rx / DC Orders ED Discharge Orders     None         Monna Fam, MD 10/29/22 2311    Melene Plan, DO 10/29/22 2314

## 2022-10-29 NOTE — ED Triage Notes (Signed)
Pt BIB [facility] from home w c/o SOB. Pt has hx of COPD and was recently hospitalized for pneumonia. Pt was walking but not walking right now.   EMS VS HR 96 R 20 BP 136/76 O2 95% 4L... wears 3L at baseline CBG 195

## 2022-10-30 DIAGNOSIS — F419 Anxiety disorder, unspecified: Secondary | ICD-10-CM | POA: Diagnosis present

## 2022-10-30 DIAGNOSIS — R0602 Shortness of breath: Secondary | ICD-10-CM | POA: Diagnosis not present

## 2022-10-30 DIAGNOSIS — N1832 Chronic kidney disease, stage 3b: Secondary | ICD-10-CM | POA: Diagnosis present

## 2022-10-30 DIAGNOSIS — Z825 Family history of asthma and other chronic lower respiratory diseases: Secondary | ICD-10-CM | POA: Diagnosis not present

## 2022-10-30 DIAGNOSIS — T83518A Infection and inflammatory reaction due to other urinary catheter, initial encounter: Secondary | ICD-10-CM | POA: Diagnosis present

## 2022-10-30 DIAGNOSIS — Z6841 Body Mass Index (BMI) 40.0 and over, adult: Secondary | ICD-10-CM | POA: Diagnosis not present

## 2022-10-30 DIAGNOSIS — F32A Depression, unspecified: Secondary | ICD-10-CM | POA: Diagnosis present

## 2022-10-30 DIAGNOSIS — E1122 Type 2 diabetes mellitus with diabetic chronic kidney disease: Secondary | ICD-10-CM | POA: Diagnosis present

## 2022-10-30 DIAGNOSIS — N390 Urinary tract infection, site not specified: Secondary | ICD-10-CM | POA: Diagnosis present

## 2022-10-30 DIAGNOSIS — Z66 Do not resuscitate: Secondary | ICD-10-CM | POA: Diagnosis present

## 2022-10-30 DIAGNOSIS — Z823 Family history of stroke: Secondary | ICD-10-CM | POA: Diagnosis not present

## 2022-10-30 DIAGNOSIS — E1142 Type 2 diabetes mellitus with diabetic polyneuropathy: Secondary | ICD-10-CM | POA: Diagnosis present

## 2022-10-30 DIAGNOSIS — R531 Weakness: Secondary | ICD-10-CM

## 2022-10-30 DIAGNOSIS — J9611 Chronic respiratory failure with hypoxia: Secondary | ICD-10-CM | POA: Diagnosis present

## 2022-10-30 DIAGNOSIS — J441 Chronic obstructive pulmonary disease with (acute) exacerbation: Secondary | ICD-10-CM | POA: Diagnosis present

## 2022-10-30 DIAGNOSIS — Z8249 Family history of ischemic heart disease and other diseases of the circulatory system: Secondary | ICD-10-CM | POA: Diagnosis not present

## 2022-10-30 DIAGNOSIS — I5022 Chronic systolic (congestive) heart failure: Secondary | ICD-10-CM | POA: Diagnosis present

## 2022-10-30 DIAGNOSIS — Z7901 Long term (current) use of anticoagulants: Secondary | ICD-10-CM | POA: Diagnosis not present

## 2022-10-30 DIAGNOSIS — B961 Klebsiella pneumoniae [K. pneumoniae] as the cause of diseases classified elsewhere: Secondary | ICD-10-CM | POA: Diagnosis present

## 2022-10-30 DIAGNOSIS — R319 Hematuria, unspecified: Secondary | ICD-10-CM | POA: Diagnosis not present

## 2022-10-30 DIAGNOSIS — Z833 Family history of diabetes mellitus: Secondary | ICD-10-CM | POA: Diagnosis not present

## 2022-10-30 DIAGNOSIS — J810 Acute pulmonary edema: Secondary | ICD-10-CM | POA: Diagnosis not present

## 2022-10-30 DIAGNOSIS — N3000 Acute cystitis without hematuria: Secondary | ICD-10-CM | POA: Diagnosis not present

## 2022-10-30 DIAGNOSIS — E1165 Type 2 diabetes mellitus with hyperglycemia: Secondary | ICD-10-CM | POA: Diagnosis present

## 2022-10-30 DIAGNOSIS — I4821 Permanent atrial fibrillation: Secondary | ICD-10-CM | POA: Diagnosis present

## 2022-10-30 DIAGNOSIS — E662 Morbid (severe) obesity with alveolar hypoventilation: Secondary | ICD-10-CM | POA: Diagnosis present

## 2022-10-30 DIAGNOSIS — Z853 Personal history of malignant neoplasm of breast: Secondary | ICD-10-CM | POA: Diagnosis not present

## 2022-10-30 DIAGNOSIS — Y846 Urinary catheterization as the cause of abnormal reaction of the patient, or of later complication, without mention of misadventure at the time of the procedure: Secondary | ICD-10-CM | POA: Diagnosis present

## 2022-10-30 DIAGNOSIS — E785 Hyperlipidemia, unspecified: Secondary | ICD-10-CM | POA: Diagnosis present

## 2022-10-30 DIAGNOSIS — I130 Hypertensive heart and chronic kidney disease with heart failure and stage 1 through stage 4 chronic kidney disease, or unspecified chronic kidney disease: Secondary | ICD-10-CM | POA: Diagnosis present

## 2022-10-30 LAB — CBC
HCT: 28.3 % — ABNORMAL LOW (ref 36.0–46.0)
Hemoglobin: 8.4 g/dL — ABNORMAL LOW (ref 12.0–15.0)
MCH: 29.9 pg (ref 26.0–34.0)
MCHC: 29.7 g/dL — ABNORMAL LOW (ref 30.0–36.0)
MCV: 100.7 fL — ABNORMAL HIGH (ref 80.0–100.0)
Platelets: 167 10*3/uL (ref 150–400)
RBC: 2.81 MIL/uL — ABNORMAL LOW (ref 3.87–5.11)
RDW: 18.6 % — ABNORMAL HIGH (ref 11.5–15.5)
WBC: 3.4 10*3/uL — ABNORMAL LOW (ref 4.0–10.5)
nRBC: 0.6 % — ABNORMAL HIGH (ref 0.0–0.2)

## 2022-10-30 LAB — BASIC METABOLIC PANEL
Anion gap: 12 (ref 5–15)
Anion gap: 7 (ref 5–15)
BUN: 34 mg/dL — ABNORMAL HIGH (ref 8–23)
BUN: 32 mg/dL — ABNORMAL HIGH (ref 8–23)
CO2: 32 mmol/L (ref 22–32)
CO2: 30 mmol/L (ref 22–32)
Calcium: 9.1 mg/dL (ref 8.9–10.3)
Calcium: 8.7 mg/dL — ABNORMAL LOW (ref 8.9–10.3)
Chloride: 96 mmol/L — ABNORMAL LOW (ref 98–111)
Chloride: 102 mmol/L (ref 98–111)
Creatinine, Ser: 1.42 mg/dL — ABNORMAL HIGH (ref 0.44–1.00)
Creatinine, Ser: 1.48 mg/dL — ABNORMAL HIGH (ref 0.44–1.00)
GFR, Estimated: 38 mL/min — ABNORMAL LOW (ref >60)
GFR, Estimated: 36 mL/min — ABNORMAL LOW (ref >60)
Glucose, Bld: 171 mg/dL — ABNORMAL HIGH (ref 70–99)
Glucose, Bld: 226 mg/dL — ABNORMAL HIGH (ref 70–99)
Potassium: 4.1 mmol/L (ref 3.5–5.1)
Potassium: 4.4 mmol/L (ref 3.5–5.1)
Sodium: 140 mmol/L (ref 135–145)
Sodium: 139 mmol/L (ref 135–145)

## 2022-10-30 LAB — GLUCOSE, CAPILLARY
Glucose-Capillary: 204 mg/dL — ABNORMAL HIGH (ref 70–99)
Glucose-Capillary: 242 mg/dL — ABNORMAL HIGH (ref 70–99)
Glucose-Capillary: 252 mg/dL — ABNORMAL HIGH (ref 70–99)
Glucose-Capillary: 208 mg/dL — ABNORMAL HIGH (ref 70–99)

## 2022-10-30 LAB — TROPONIN I (HIGH SENSITIVITY): Troponin I (High Sensitivity): 15 ng/L (ref <18)

## 2022-10-30 LAB — URINALYSIS, ROUTINE W REFLEX MICROSCOPIC
Bilirubin Urine: NEGATIVE
Glucose, UA: NEGATIVE mg/dL
Ketones, ur: NEGATIVE mg/dL
Nitrite: POSITIVE — AB
Protein, ur: 100 mg/dL — AB
RBC / HPF: >50 RBC/hpf (ref 0–5)
Specific Gravity, Urine: 1.014 (ref 1.005–1.030)
WBC, UA: >50 WBC/hpf (ref 0–5)
pH: 5 (ref 5.0–8.0)

## 2022-10-30 LAB — ABO/RH: ABO/RH(D): O POS

## 2022-10-30 LAB — TYPE AND SCREEN
ABO/RH(D): O POS
Antibody Screen: NEGATIVE

## 2022-10-30 LAB — MAGNESIUM: Magnesium: 1.7 mg/dL (ref 1.7–2.4)

## 2022-10-30 LAB — BRAIN NATRIURETIC PEPTIDE: B Natriuretic Peptide: 455.6 pg/mL — ABNORMAL HIGH (ref 0.0–100.0)

## 2022-10-30 LAB — PHOSPHORUS: Phosphorus: 4.3 mg/dL (ref 2.5–4.6)

## 2022-10-30 MED ORDER — POLYETHYLENE GLYCOL 3350 17 G PO PACK: 17.0000 g | PACK | Freq: Every day | ORAL | Status: DC | PRN

## 2022-10-30 MED ORDER — PROCHLORPERAZINE EDISYLATE 10 MG/2ML IJ SOLN: 5.0000 mg | Freq: Four times a day (QID) | INTRAMUSCULAR | Status: DC | PRN

## 2022-10-30 MED ORDER — OXYCODONE-ACETAMINOPHEN 5-325 MG PO TABS: 2.0000 | ORAL_TABLET | Freq: Once | ORAL | Status: DC

## 2022-10-30 MED ORDER — ACETAMINOPHEN 325 MG PO TABS
650.0000 mg | ORAL_TABLET | Freq: Four times a day (QID) | ORAL | Status: DC | PRN
  Administered 2022-11-04 – 2022-11-07 (×4): 650 mg via ORAL
  Filled 2022-10-30 (×4): qty 2

## 2022-10-30 MED ORDER — FUROSEMIDE 10 MG/ML IJ SOLN
20.0000 mg | Freq: Two times a day (BID) | INTRAMUSCULAR | Status: DC
  Filled 2022-10-30: qty 2

## 2022-10-30 MED ORDER — FUROSEMIDE 10 MG/ML IJ SOLN
40.0000 mg | Freq: Once | INTRAMUSCULAR | Status: AC
  Administered 2022-10-30: 40 mg via INTRAVENOUS
  Filled 2022-10-30: qty 4

## 2022-10-30 MED ORDER — DULOXETINE HCL 30 MG PO CPEP
60.0000 mg | ORAL_CAPSULE | Freq: Every day | ORAL | Status: DC
  Administered 2022-10-30 – 2022-11-10 (×12): 60 mg via ORAL
  Filled 2022-10-30 (×12): qty 2

## 2022-10-30 MED ORDER — SODIUM CHLORIDE 0.9 % IV SOLN
2.0000 g | INTRAVENOUS | Status: DC
  Administered 2022-10-30 – 2022-10-31 (×2): 2 g via INTRAVENOUS
  Filled 2022-10-30 (×2): qty 20

## 2022-10-30 MED ORDER — GABAPENTIN 100 MG PO CAPS
100.0000 mg | ORAL_CAPSULE | Freq: Two times a day (BID) | ORAL | Status: DC
  Administered 2022-10-30: 100 mg via ORAL
  Filled 2022-10-30: qty 1

## 2022-10-30 MED ORDER — INSULIN ASPART 100 UNIT/ML IJ SOLN
0.0000 [IU] | Freq: Every day | INTRAMUSCULAR | Status: DC
  Administered 2022-10-30: 2 [IU] via SUBCUTANEOUS

## 2022-10-30 MED ORDER — MELATONIN 5 MG PO TABS
5.0000 mg | ORAL_TABLET | Freq: Every evening | ORAL | Status: DC | PRN
  Administered 2022-10-30 – 2022-11-09 (×5): 5 mg via ORAL
  Filled 2022-10-30 (×5): qty 1

## 2022-10-30 MED ORDER — APIXABAN 5 MG PO TABS
5.0000 mg | ORAL_TABLET | Freq: Two times a day (BID) | ORAL | Status: DC
  Administered 2022-10-30 – 2022-11-10 (×24): 5 mg via ORAL
  Filled 2022-10-30 (×24): qty 1

## 2022-10-30 MED ORDER — HEPARIN SODIUM (PORCINE) 5000 UNIT/ML IJ SOLN: 5000.0000 [IU] | Freq: Three times a day (TID) | INTRAMUSCULAR | Status: DC

## 2022-10-30 MED ORDER — CARVEDILOL 25 MG PO TABS
25.0000 mg | ORAL_TABLET | Freq: Two times a day (BID) | ORAL | Status: DC
  Administered 2022-10-30: 25 mg via ORAL
  Filled 2022-10-30: qty 1

## 2022-10-30 MED ORDER — OXYCODONE HCL 5 MG PO TABS
5.0000 mg | ORAL_TABLET | ORAL | Status: DC | PRN
  Administered 2022-11-09 – 2022-11-10 (×2): 5 mg via ORAL
  Filled 2022-10-30 (×2): qty 1

## 2022-10-30 MED ORDER — ATORVASTATIN CALCIUM 10 MG PO TABS
10.0000 mg | ORAL_TABLET | Freq: Every day | ORAL | Status: DC
  Administered 2022-10-30 – 2022-11-10 (×12): 10 mg via ORAL
  Filled 2022-10-30 (×12): qty 1

## 2022-10-30 MED ORDER — SODIUM CHLORIDE 0.9 % IV SOLN
2.0000 g | Freq: Once | INTRAVENOUS | Status: AC
  Administered 2022-10-30: 2 g via INTRAVENOUS
  Filled 2022-10-30: qty 20

## 2022-10-30 MED ORDER — OXYCODONE-ACETAMINOPHEN 7.5-325 MG PO TABS: 1.0000 | ORAL_TABLET | ORAL | Status: DC | PRN

## 2022-10-30 MED ORDER — INSULIN ASPART 100 UNIT/ML IJ SOLN
0.0000 [IU] | Freq: Three times a day (TID) | INTRAMUSCULAR | Status: DC
  Administered 2022-10-30 (×2): 7 [IU] via SUBCUTANEOUS
  Administered 2022-10-30: 11 [IU] via SUBCUTANEOUS
  Administered 2022-10-31 – 2022-11-03 (×8): 3 [IU] via SUBCUTANEOUS
  Administered 2022-11-03: 4 [IU] via SUBCUTANEOUS
  Administered 2022-11-04 – 2022-11-05 (×3): 3 [IU] via SUBCUTANEOUS
  Administered 2022-11-05: 4 [IU] via SUBCUTANEOUS
  Administered 2022-11-06 – 2022-11-07 (×3): 3 [IU] via SUBCUTANEOUS
  Administered 2022-11-08: 4 [IU] via SUBCUTANEOUS

## 2022-10-30 MED ORDER — OXYCODONE-ACETAMINOPHEN 5-325 MG PO TABS
1.0000 | ORAL_TABLET | ORAL | Status: DC | PRN
  Administered 2022-10-31 – 2022-11-10 (×17): 1 via ORAL
  Filled 2022-10-30 (×17): qty 1

## 2022-10-30 MED ORDER — MIRTAZAPINE 15 MG PO TABS
7.5000 mg | ORAL_TABLET | Freq: Every day | ORAL | Status: DC
  Administered 2022-10-30 – 2022-11-10 (×12): 7.5 mg via ORAL
  Filled 2022-10-30 (×12): qty 1

## 2022-10-30 MED ORDER — FUROSEMIDE 20 MG PO TABS
20.0000 mg | ORAL_TABLET | Freq: Two times a day (BID) | ORAL | Status: DC
  Administered 2022-10-30 – 2022-11-10 (×23): 20 mg via ORAL
  Filled 2022-10-30 (×23): qty 1

## 2022-10-30 NOTE — ED Notes (Signed)
ED TO INPATIENT HANDOFF REPORT  ED Nurse Name and Phone #: Amil Amen [phone]  S Name/Age/Gender Sheryl Aguilar 77 y.o. female Room/Bed: 004C/004C  Code Status   Code Status: DNR  Home/SNF/Other Home Patient oriented to: self, place, time, and situation Is this baseline? Yes   Triage Complete: Triage complete  Chief Complaint Generalized weakness [R53.1]  Triage Note Pt BIB [facility] from home w c/o SOB. Pt has hx of COPD and was recently hospitalized for pneumonia. Pt was walking but not walking right now.   EMS VS HR 96 R 20 BP 136/76 O2 95% 4L... wears 3L at baseline CBG 195    Allergies Allergies  Allergen Reactions   Filgrastim Swelling and Shortness Of Breath    Hypotension.    Sargramostim Shortness Of Breath    Level of Care/Admitting Diagnosis ED Disposition     ED Disposition  Admit   Condition  --   Comment  Hospital Area: [facility] [[id number]]  Level of Care: Telemetry Medical [104]  May admit patient to [facility] or [facility] if equivalent level of care is available:: Yes  Covid Evaluation: Asymptomatic - no recent exposure (last 10 days) testing not required  Diagnosis: Generalized weakness [784696]  Admitting Physician: Darlin Drop [[id number]]  Attending Physician: Darlin Drop [[id number]]  Certification:: I certify this patient will need inpatient services for at least 2 midnights  Estimated Length of Stay: 2          B Medical/Surgery History Past Medical History:  Diagnosis Date   A-fib (HCC)    Arthritis    Breast CA (HCC)    Chronic pain    Colonic mass    s/p resection in March 22   Diabetes mellitus without complication (HCC)    DVT (deep venous thrombosis) (HCC)    Hypertension    Past Surgical History:  Procedure Laterality Date   BREAST LUMPECTOMY     CHOLECYSTECTOMY     Extended right colectomy  06/2020   KIDNEY STONE SURGERY Bilateral    LYMPHADENECTOMY     TONSILLECTOMY       A IV  Location/Drains/Wounds Patient Lines/Drains/Airways Status     Active Line/Drains/Airways     Name Placement date Placement time Site Days   Peripheral IV 10/29/22 20 G Right Antecubital 10/29/22  2306  Antecubital  1            Intake/Output Last 24 hours  Intake/Output Summary (Last 24 hours) at 10/30/2022 0413 Last data filed at 10/30/2022 0230 Gross per 24 hour  Intake 44.6 ml  Output --  Net 44.6 ml    Labs/Imaging Results for orders placed or performed during the hospital encounter of 10/29/22 (from the past 48 hour(s))  CBC with Differential     Status: Abnormal   Collection Time: 10/29/22 11:04 PM  Result Value Ref Range   WBC 5.2 4.0 - 10.5 K/uL   RBC 2.89 (L) 3.87 - 5.11 MIL/uL   Hemoglobin 8.7 (L) 12.0 - 15.0 g/dL   HCT 27.2 (L) 53.6 - 64.4 %   MCV 100.3 (H) 80.0 - 100.0 fL   MCH 30.1 26.0 - 34.0 pg   MCHC 30.0 30.0 - 36.0 g/dL   RDW 03.4 (H) 74.2 - 59.5 %   Platelets 180 150 - 400 K/uL   nRBC 0.0 0.0 - 0.2 %   Neutrophils Relative % 69 %   Neutro Abs 3.6 1.7 - 7.7 K/uL   Lymphocytes Relative 17 %  Lymphs Abs 0.9 0.7 - 4.0 K/uL   Monocytes Relative 9 %   Monocytes Absolute 0.4 0.1 - 1.0 K/uL   Eosinophils Relative 3 %   Eosinophils Absolute 0.2 0.0 - 0.5 K/uL   Basophils Relative 1 %   Basophils Absolute 0.0 0.0 - 0.1 K/uL   Immature Granulocytes 1 %   Abs Immature Granulocytes 0.06 0.00 - 0.07 K/uL    Comment: Performed at [facility], [address], [place], [place] [address]  Basic metabolic panel     Status: Abnormal   Collection Time: 10/29/22 11:04 PM  Result Value Ref Range   Sodium 140 135 - 145 mmol/L   Potassium 4.1 3.5 - 5.1 mmol/L   Chloride 96 (L) 98 - 111 mmol/L   CO2 32 22 - 32 mmol/L   Glucose, Bld 171 (H) 70 - 99 mg/dL    Comment: Glucose reference range applies only to samples taken after fasting for at least 8 hours.   BUN 34 (H) 8 - 23 mg/dL   Creatinine, Ser 1.19 (H) 0.44 - 1.00 mg/dL   Calcium 9.1 8.9 - 14.7 mg/dL    GFR, Estimated 38 (L) >60 mL/min    Comment: (NOTE) Calculated using the CKD-EPI Creatinine Equation (2021)    Anion gap 12 5 - 15    Comment: Performed at [facility], [address], [place], [place] [address]  Troponin I (High Sensitivity)     Status: None   Collection Time: 10/29/22 11:04 PM  Result Value Ref Range   Troponin I (High Sensitivity) 15 <18 ng/L    Comment: (NOTE) Elevated high sensitivity troponin I (hsTnI) values and significant  changes across serial measurements may suggest ACS but many other  chronic and acute conditions are known to elevate hsTnI results.  Refer to the "Links" section for chest pain algorithms and additional  guidance. Performed at [facility], [address], [place], [place] [address]   Brain natriuretic peptide     Status: Abnormal   Collection Time: 10/29/22 11:04 PM  Result Value Ref Range   B Natriuretic Peptide 455.6 (H) 0.0 - 100.0 pg/mL    Comment: Performed at [facility], [address] [place], [place] [address]  Urinalysis, Routine w reflex microscopic -Urine, Catheterized     Status: Abnormal   Collection Time: 10/29/22 11:16 PM  Result Value Ref Range   Color, Urine AMBER (A) YELLOW    Comment: BIOCHEMICALS MAY BE AFFECTED BY COLOR   APPearance CLOUDY (A) CLEAR   Specific Gravity, Urine 1.014 1.005 - 1.030   pH 5.0 5.0 - 8.0   Glucose, UA NEGATIVE NEGATIVE mg/dL   Hgb urine dipstick LARGE (A) NEGATIVE   Bilirubin Urine NEGATIVE NEGATIVE   Ketones, ur NEGATIVE NEGATIVE mg/dL   Protein, ur 696 (A) NEGATIVE mg/dL   Nitrite POSITIVE (A) NEGATIVE   Leukocytes,Ua LARGE (A) NEGATIVE   RBC / HPF >50 0 - 5 RBC/hpf   WBC, UA >50 0 - 5 WBC/hpf   Bacteria, UA MANY (A) NONE SEEN   Squamous Epithelial / HPF 0-5 0 - 5 /HPF   WBC Clumps PRESENT    Mucus PRESENT    Non Squamous Epithelial 0-5 (A) NONE SEEN    Comment: Performed at [facility], [address] [place], [place] [address]  Type and  screen     Status: None   Collection Time: 10/30/22  2:01 AM  Result Value Ref  Range   ABO/RH(D) O POS    Antibody Screen NEG    Sample Expiration      11/02/2022,2359 Performed at [facility], [address] [place], [place] [address]   ABO/Rh     Status: None   Collection Time: 10/30/22  2:03 AM  Result Value Ref Range   ABO/RH(D)      O POS Performed at [facility], [address], [place], [place] [address]    DG Chest Port 1 View  Result Date: 10/29/2022 CLINICAL DATA:  Shortness of breath EXAM: PORTABLE CHEST 1 VIEW COMPARISON:  Chest x-ray 10/07/2022 FINDINGS: The heart is enlarged. There central pulmonary vascular congestion. There are interstitial and hazy opacities throughout the right mid and lower lung and left lower lung. No pleural effusion or pneumothorax. No acute fractures. IMPRESSION: 1. Cardiomegaly with central pulmonary vascular congestion. 2. Interstitial and hazy opacities throughout the right mid and lower lung and left lower lung, which may represent asymmetric edema or infection. Electronically Signed   By: Darliss Cheney M.D.   On: 10/29/2022 23:00    Pending Labs Unresulted Labs (From admission, onward)     Start     Ordered   10/30/22 0500  Hemoglobin A1c  Tomorrow morning,   R       Comments: To assess prior glycemic control    10/30/22 0345   10/30/22 0039  Urine Culture  Add-on,   AD       Question:  Indication  Answer:  Dysuria   10/30/22 0039            Vitals/Pain Today's Vitals   10/30/22 0330 10/30/22 0345 10/30/22 0351 10/30/22 0412  BP: 118/70 (!) 104/53    Pulse: 99 81    Resp: (!) 26 (!) 22    Temp:    98.7 F (37.1 C)  TempSrc:    Oral  SpO2: 97% 100%    Weight:      Height:      PainSc:   0-No pain     Isolation Precautions No active isolations  Medications Medications  oxyCODONE-acetaminophen (PERCOCET/ROXICET) 5-325 MG per tablet 2 tablet (2 tablets Oral Not Given 10/30/22 0339)  cefTRIAXone  (ROCEPHIN) 2 g in sodium chloride 0.9 % 100 mL IVPB (has no administration in time range)  furosemide (LASIX) injection 20 mg (20 mg Intravenous Not Given 10/30/22 0348)  insulin aspart (novoLOG) injection 0-20 Units (has no administration in time range)  insulin aspart (novoLOG) injection 0-5 Units (has no administration in time range)  acetaminophen (TYLENOL) tablet 650 mg (has no administration in time range)  prochlorperazine (COMPAZINE) injection 5 mg (has no administration in time range)  polyethylene glycol (MIRALAX / GLYCOLAX) packet 17 g (has no administration in time range)  melatonin tablet 5 mg (has no administration in time range)  ondansetron (ZOFRAN) injection 4 mg (4 mg Intravenous Given 10/29/22 2308)  ipratropium-albuterol (DUONEB) 0.5-2.5 (3) MG/3ML nebulizer solution 3 mL (3 mLs Nebulization Given 10/29/22 2307)  methylPREDNISolone sodium succinate (SOLU-MEDROL) 125 mg/2 mL injection 125 mg (125 mg Intravenous Given 10/29/22 2308)  furosemide (LASIX) injection 40 mg (40 mg Intravenous Given 10/30/22 0154)  cefTRIAXone (ROCEPHIN) 2 g in sodium chloride 0.9 % 100 mL IVPB (0 g Intravenous Stopped 10/30/22 0230)    Mobility walks with device     Focused Assessments Pulmonary Assessment Handoff:  Lung sounds: Bilateral Breath Sounds: Diminished L Breath Sounds: Diminished R Breath Sounds: Diminished O2 Device: Nasal Cannula  O2 Flow Rate (L/min): 3 L/min    R Recommendations: See Admitting Provider Note  Report given to:   Additional Notes: 4L [place], sats 100%

## 2022-10-30 NOTE — TOC Initial Note (Signed)
Transition of Care [facility]) - Initial/Assessment Note    Patient Details  Name: Sheryl Aguilar MRN: [id number] Date of Birth: July 02, 1945  Transition of [facility]) CM/SW Contact:    Luticia Tadros A [place], Theresia Majors Phone Number: 10/30/2022, 4:09 PM  Clinical Narrative:                  CSW met with pt at bedside. She is agreeable to SNF referral. She said had a preference for [facility] in [place].  CSW will complete SNF workup.   TOC will continue to follow.  Expected Discharge Plan: Skilled Nursing Facility Barriers to Discharge: Continued Medical Work up, SNF Pending bed offer, Insurance Authorization   Patient Goals and CMS Choice Patient states their goals for this hospitalization and ongoing recovery are:: Go home          Expected Discharge Plan and Services In-house Referral: Clinical Social Work   Post Acute Care Choice: Skilled Nursing Facility Living arrangements for the past 2 months: Single Family Home                                      Prior Living Arrangements/Services Living arrangements for the past 2 months: Single Family Home Lives with:: Self          Need for Family Participation in Patient Care: No (Comment) Care giver support system in place?: Yes (comment)   Criminal Activity/Legal Involvement Pertinent to Current Situation/Hospitalization: No - Comment as needed  Activities of Daily Living Home Assistive Devices/Equipment: Oxygen, Dentures (specify type), BIPAP ADL Screening (condition at time of admission) Patient's cognitive ability adequate to safely complete daily activities?: Yes Is the patient deaf or have difficulty hearing?: No Does the patient have difficulty seeing, even when wearing glasses/contacts?: No Does the patient have difficulty concentrating, remembering, or making decisions?: No Patient able to express need for assistance with ADLs?: Yes Does the patient have difficulty dressing or bathing?: No Independently performs  ADLs?: No Communication: Appropriate for developmental age, Needs assistance Is this a change from baseline?: Pre-admission baseline Dressing (OT): Appropriate for developmental age Grooming: Appropriate for developmental age Feeding: Independent Bathing: Appropriate for developmental age Toileting: Needs assistance Is this a change from baseline?: Pre-admission baseline In/Out Bed: Needs assistance Walks in Home: Needs assistance Is this a change from baseline?: Pre-admission baseline Does the patient have difficulty walking or climbing stairs?: Yes Weakness of Legs: Both Weakness of Arms/Hands: None  Permission Sought/Granted                  Emotional Assessment Appearance:: Well-Groomed Attitude/Demeanor/Rapport: Engaged Affect (typically observed): Pleasant Orientation: : Oriented to Self, Oriented to Place, Oriented to  Time, Oriented to Situation Alcohol / Substance Use: Not Applicable Psych Involvement: No (comment)  Admission diagnosis:  Shortness of breath [R06.02] Acute pulmonary edema (HCC) [J81.0] Generalized weakness [R53.1] Urinary tract infection with hematuria, site unspecified [N39.0, R31.9] Patient Active Problem List   Diagnosis Date Noted   Generalized weakness 10/30/2022   UTI (urinary tract infection) 01/23/2021   COVID-19 virus infection 01/23/2021   AKI (acute kidney injury) (HCC) 01/23/2021   Chronic respiratory failure with hypoxia (HCC) 01/23/2021   A-fib (HCC) 01/23/2021   Chronic pain 01/23/2021   PCP:  Andreas Blower., MD Pharmacy:   [facility] [phone] - [place], [place] - [address] AT [facility] [address] [place] [address] Phone: [phone] Fax: [phone]     Social Determinants of Health (SDOH) Social History: SDOH Screenings   Food Insecurity: No Food Insecurity (10/30/2022)  Housing: Patient Declined (10/30/2022)  Transportation Needs: No Transportation Needs (10/30/2022)   Utilities: Not At Risk (10/30/2022)  Financial Resource Strain: Medium Risk (03/17/2022)   [facility] from [facility] visits prior to 06/21/2022., [facility]  Physical Activity: Inactive (03/17/2022)   [facility] prior to 06/21/2022., [facility]  Social Connections: Socially Isolated (03/17/2022)   [facility] from [facility] visits prior to 06/21/2022., [facility]  Stress: No Stress Concern Present (03/17/2022)   [facility] visits prior to 06/21/2022., [facility]  Tobacco Use: Low Risk  (10/29/2022)  Recent Concern: Tobacco Use - Medium Risk (10/24/2022)   Received from [facility]   SDOH Interventions:     Readmission Risk Interventions     No data to display

## 2022-10-30 NOTE — [place] FL2 (Signed)
[place] MEDICAID [id number] LEVEL OF CARE FORM     IDENTIFICATION  Patient Name: Sheryl Aguilar Birthdate: January 22, 1946 Sex: female Admission Date (Current Location): 10/29/2022  [place] and [place] Number:  Haynes Bast [id number] O Facility and Address:  [facility], [address], [place], [place] [address]      Provider Number: [id number]  Attending Physician Name and Address:  Noralee Stain, DO  Relative Name and Phone Number:  Jodelle Green (Daughter)  [phone]    Current Level of Care: Hospital Recommended Level of Care: Skilled Nursing Facility Prior Approval Number:    Date Approved/Denied:   PASRR Number: [id number]  Discharge Plan: SNF    Current Diagnoses: Patient Active Problem List   Diagnosis Date Noted   Generalized weakness 10/30/2022   UTI (urinary tract infection) 01/23/2021   COVID-19 virus infection 01/23/2021   AKI (acute kidney injury) (HCC) 01/23/2021   Chronic respiratory failure with hypoxia (HCC) 01/23/2021   A-fib (HCC) 01/23/2021   Chronic pain 01/23/2021    Orientation RESPIRATION BLADDER Height & Weight     Self, Time, Situation, Place  O2 (4L) Incontinent Weight: (!) 330 lb (149.7 kg) Height:  5\' 4"  (162.6 cm)  BEHAVIORAL SYMPTOMS/MOOD NEUROLOGICAL BOWEL NUTRITION STATUS      Continent Diet (see discharge summary)  AMBULATORY STATUS COMMUNICATION OF NEEDS Skin   Extensive Assist Verbally Skin abrasions (skin tear, buttocks, foam dressing, lift every shift to assess)                       Personal Care Assistance Level of Assistance  Bathing, Feeding, Dressing Bathing Assistance: Maximum assistance Feeding assistance: Independent Dressing Assistance: Maximum assistance     Functional Limitations Info  Sight, Hearing, Speech Sight Info: Adequate Hearing Info: Adequate Speech Info: Adequate    SPECIAL CARE FACTORS FREQUENCY  PT (By licensed PT), OT (By licensed OT)     PT Frequency: 5x/week OT  Frequency: 5x/week            Contractures Contractures Info: Not present    Additional Factors Info  Allergies, Code Status, Insulin Sliding Scale Code Status Info: FULL Allergies Info: Filgrastim, Sargramostim   Insulin Sliding Scale Info: see discharge summary       Current Medications (10/30/2022):  This is the current hospital active medication list Current Facility-Administered Medications  Medication Dose Route Frequency Provider Last Rate Last Admin   acetaminophen (TYLENOL) tablet 650 mg  650 mg Oral Q6H PRN Darlin Drop, DO       apixaban (ELIQUIS) tablet 5 mg  5 mg Oral BID Noralee Stain, DO   5 mg at 10/30/22 1439   atorvastatin (LIPITOR) tablet 10 mg  10 mg Oral Daily Noralee Stain, DO   10 mg at 10/30/22 1621   carvedilol (COREG) tablet 25 mg  25 mg Oral BID WC Noralee Stain, DO   25 mg at 10/30/22 1621   cefTRIAXone (ROCEPHIN) 2 g in sodium chloride 0.9 % 100 mL IVPB  2 g Intravenous Q24H Hall, Carole N, DO       DULoxetine (CYMBALTA) DR capsule 60 mg  60 mg Oral Daily Dow Adolph N, DO   60 mg at 10/30/22 0856   furosemide (LASIX) tablet 20 mg  20 mg Oral BID Noralee Stain, DO       insulin aspart (novoLOG) injection 0-20 Units  0-20 Units Subcutaneous TID WC Dow Adolph N, DO   7 Units at 10/30/22 1145   insulin aspart (  novoLOG) injection 0-5 Units  0-5 Units Subcutaneous QHS Hall, Carole N, DO       melatonin tablet 5 mg  5 mg Oral QHS PRN Dow Adolph N, DO       mirtazapine (REMERON) tablet 7.5 mg  7.5 mg Oral QHS Noralee Stain, DO       oxyCODONE-acetaminophen (PERCOCET/ROXICET) 5-325 MG per tablet 1 tablet  1 tablet Oral Q4H PRN Noralee Stain, DO       And   oxyCODONE (Oxy IR/ROXICODONE) immediate release tablet 5 mg  5 mg Oral Q4H PRN Noralee Stain, DO       polyethylene glycol (MIRALAX / GLYCOLAX) packet 17 g  17 g Oral Daily PRN Dow Adolph N, DO       prochlorperazine (COMPAZINE) injection 5 mg  5 mg Intravenous Q6H PRN Darlin Drop, DO          Discharge Medications: Please see discharge summary for a list of discharge medications.  Relevant Imaging Results:  Relevant Lab Results:   Additional Information SSN: [id number]  Ratasha Fabre A [place], [place]

## 2022-10-30 NOTE — Evaluation (Signed)
Occupational Therapy Evaluation Patient Details Name: Sheryl Aguilar MRN: [id number] DOB: May 25, 1945 Today's Date: 10/30/2022   History of Present Illness 77 y.o. female admitted 7/10 with SOB, weakness. PMHx: pt D/Cd from [facility] 10/28/22 after 6 week stay for PNA and GIB. Severe morbid obesity, COPD, OSA on CPAP, chronic hypoxia on 3 L, HFrEF, Afib, chronic pain, breast CA   Clinical Impression   PTA, pt home for one day since 6 week admission at [facility] and was needing assist with LB ADL at bed level. Upon eval, pt with decreased strength, balance, and activity tolerance. Pt performing UB Adl with set-up A and LB Adl with total A. Performing STS transfers with mod A +2 with use of stedy.  Significant difficulty bringing hips forward and elevating trunk with transfers. Pt to continue to benefit from skilled OT services to optimize safety and independence with ADL as well as decrease burden of care. Patient will benefit and agreeable to continued inpatient follow up therapy, <3 hours/day.      Recommendations for follow up therapy are one component of a multi-disciplinary discharge planning process, led by the attending physician.  Recommendations may be updated based on patient status, additional functional criteria and insurance authorization.   Assistance Recommended at Discharge Frequent or constant Supervision/Assistance  Patient can return home with the following Two people to help with walking and/or transfers;Two people to help with bathing/dressing/bathroom;A lot of help with bathing/dressing/bathroom;Assistance with cooking/housework;Assist for transportation;Help with stairs or ramp for entrance    Functional Status Assessment  Patient has had a recent decline in their functional status and demonstrates the ability to make significant improvements in function in a reasonable and predictable amount of time.  Equipment Recommendations  Other (comment) (defer)    Recommendations for  Other Services       Precautions / Restrictions Precautions Precautions: Fall Restrictions Weight Bearing Restrictions: No      Mobility Bed Mobility Overal bed mobility: Needs Assistance Bed Mobility: Rolling, Sidelying to Sit Rolling: Min assist Sidelying to sit: Mod assist       General bed mobility comments: Cues for optimal technique and assist for truncal elevation.    Transfers Overall transfer level: Needs assistance Equipment used: Ambulation equipment used Antony Salmon) Transfers: Sit to/from Stand Sit to Stand: Mod assist, +2 physical assistance, +2 safety/equipment           General transfer comment: Assist for boost up from EOB and chair. Pt unable to achieve fully upright position on 2 attempts with bottom out and chest down. Significant assist to bring hips forward      Balance Overall balance assessment: Needs assistance Sitting-balance support: Bilateral upper extremity supported, No upper extremity supported, Feet supported Sitting balance-Leahy Scale: Fair     Standing balance support: Bilateral upper extremity supported, Reliant on assistive device for balance   Standing balance comment: Unable to achieve fully upright position and heavy reliance on Stedy/assist                           ADL either performed or assessed with clinical judgement   ADL Overall ADL's : Needs assistance/impaired Eating/Feeding: Modified independent;Sitting Eating/Feeding Details (indicate cue type and reason): supported Grooming: Set up;Wash/dry hands;Sitting;Supervision/safety Grooming Details (indicate cue type and reason): supervision at EOB. Fair sitting balance. Upper Body Bathing: Set up;Sitting   Lower Body Bathing: Total assistance;Sit to/from stand   Upper Body Dressing : Set up;Supervision/safety;Sitting   Lower Body Dressing: Total assistance;Sit  to/from Database administrator: Moderate assistance;+2 for physical assistance;+2 for  safety/equipment Toilet Transfer Details (indicate cue type and reason): stedy         Functional mobility during ADLs: Moderate assistance;+2 for physical assistance;+2 for safety/equipment General ADL Comments: STS with stedy this session to chair     Vision Baseline Vision/History: 0 No visual deficits (wears readers) Ability to See in Adequate Light: 0 Adequate Patient Visual Report: No change from baseline Vision Assessment?: No apparent visual deficits     Perception Perception Perception Tested?: No   Praxis Praxis Praxis tested?: Not tested    Pertinent Vitals/Pain Pain Assessment Pain Assessment: Faces Faces Pain Scale: Hurts little more Pain Location: Bil ankles Pain Descriptors / Indicators: Sore Pain Intervention(s): Limited activity within patient's tolerance, Monitored during session     Hand Dominance     Extremity/Trunk Assessment Upper Extremity Assessment Upper Extremity Assessment: Generalized weakness (ROM WFL, strength testing overall 4-/5)   Lower Extremity Assessment Lower Extremity Assessment: Defer to PT evaluation   Cervical / Trunk Assessment Cervical / Trunk Assessment: Other exceptions (large body habitus)   Communication Communication Communication: No difficulties   Cognition Arousal/Alertness: Awake/alert Behavior During Therapy: WFL for tasks assessed/performed Overall Cognitive Status: Impaired/Different from baseline Area of Impairment: Safety/judgement, Awareness, Problem solving                 Orientation Level:  (to day)       Safety/Judgement: Decreased awareness of safety Awareness: Emergent, Anticipatory (however, sometimes over anticipates difficulty) Problem Solving: Slow processing, Requires verbal cues General Comments: Pt cognition likely near baseline. Some difficulty reasoing with transfers and poor awareness of abilities that she does posess, but overall with good ability to sequence tasks and follow  commands.     General Comments       Exercises     Shoulder Instructions      Home Living Family/patient expects to be discharged to:: Private residence Living Arrangements: Children Available Help at Discharge: Family;Available PRN/intermittently Type of Home: Apartment Home Access: Elevator (takes elevator to 4th floor)     Home Layout: One level     Bathroom Shower/Tub: Tub/shower unit     Bathroom Accessibility: Yes How Accessible: Accessible via wheelchair Home Equipment: Adaptive equipment;Hand held shower head;Hospital bed;Wheelchair - manual;Tub bench;BSC/3in1 (hoyer but does not have sling) Adaptive Equipment: Long-handled sponge        Prior Functioning/Environment Prior Level of Function : Needs assist             Mobility Comments: had been scooting to chair and other drop arm equipment until recent hospitalization at [facility]; was unable to get OOB the day she was home from [facility]. Last stood in April ADLs Comments: prior to admission at [facility] had been doing her own bathing and dressing per pt, but has been total A for all LB ADL while hospitalized at [facility] and when she was home 1 day        OT Problem List: Decreased strength;Impaired balance (sitting and/or standing);Decreased activity tolerance;Decreased safety awareness;Decreased knowledge of use of DME or AE      OT Treatment/Interventions: Self-care/ADL training;Therapeutic exercise;DME and/or AE instruction;Patient/family education;Balance training;Therapeutic activities    OT Goals(Current goals can be found in the care plan section) Acute Rehab OT Goals Patient Stated Goal: go to rehab OT Goal Formulation: With patient Time For Goal Achievement: 11/13/22 Potential to Achieve Goals: Fair  OT Frequency: Min 2X/week    Co-evaluation PT/OT/SLP Co-Evaluation/Treatment: Yes Reason  for Co-Treatment: For patient/therapist safety;To address functional/ADL transfers   OT goals  addressed during session: ADL's and self-care      AM-PAC OT "6 Clicks" Daily Activity     Outcome Measure Help from another person eating meals?: None Help from another person taking care of personal grooming?: A Little Help from another person toileting, which includes using toliet, bedpan, or urinal?: Total Help from another person bathing (including washing, rinsing, drying)?: A Lot Help from another person to put on and taking off regular upper body clothing?: A Little Help from another person to put on and taking off regular lower body clothing?: Total 6 Click Score: 14   End of Session Equipment Utilized During Treatment: Other (comment);Oxygen (3L; stedy) Nurse Communication: Mobility status;Need for lift equipment (stedy+2)  Activity Tolerance: Patient tolerated treatment well Patient left: with call bell/phone within reach;in chair;with chair alarm set;with nursing/sitter in room  OT Visit Diagnosis: Unsteadiness on feet (R26.81);Muscle weakness (generalized) (M62.81);Other abnormalities of gait and mobility (R26.89)                Time: 1610-9604 OT Time Calculation (min): 22 min Charges:  OT General Charges $OT Visit: 1 Visit OT Evaluation $OT Eval Moderate Complexity: 1 Mod  Sheryl Aguilar, OTR/L [facility] Acute Rehabilitation Office: [phone]   Sheryl Aguilar 10/30/2022, 9:58 AM

## 2022-10-30 NOTE — Plan of Care (Signed)

## 2022-10-30 NOTE — Progress Notes (Signed)
  PROGRESS NOTE  Patient admitted earlier this morning. See H&P.   Sheryl Aguilar is a 77 y.o. female with medical history significant for severe morbid obesity with BMI of 56, COPD, OSA on CPAP, chronic hypoxia on 3 L nasal cannula continuously, chronic HFrEF, permanent atrial fibrillation unclear if she is still taking OAC, recently discharged from outside facility 10/28/22 after 6 weeks hospital stay at [facility].  She was treated for pneumonia.  Her hospital stay was complicated by GI bleed, for which she received blood transfusion and had an EGD.  Was also complicated by HCAP and fluid overload she was treated for HCAP and she had a thoracentesis.  The patient was discharged home on 7/9.  She states that she was unable to walk, but her daughter declined her to be placed in skilled nursing facility, and so was sent home with home health.  At home, she was unable to get from bed to commode and remained extremely weak.  She ended up back at [facility].  She also admits to dysuria.  She was discharged home from [facility] with a Foley catheter.  She did not use a Foley catheter prior to this hospitalization.  She states that she was sent home with this, as she was unable to ambulate.  She denies any urinary retention issues.  -Remove Foley catheter today -Rocephin for CAUTI.  Urine culture ordered -Uses 3 L nasal cannula O2 at baseline -PT OT evaluation, suspect she will need SNF placement.  TOC consulted. -Her baseline creatinine is 1.4, remains stable  Status is: Inpatient Remains inpatient appropriate because: Need placement.  Continue IV antibiotics   Noralee Stain, DO [facility] 10/30/2022, 12:23 PM  Available via Epic secure chat 7am-7pm After these hours, please refer to coverage provider listed on [email or web address]

## 2022-10-30 NOTE — H&P (Addendum)
History and Physical  [name] ZOX:[id number] DOB: [birth year] DOA: 10/29/2022  Referring physician: Dr. Clayborne Dana, EDP  PCP: Andreas Blower., MD  Outpatient Specialists: None Patient coming from: Home  Chief Complaint: Generalized weakness and shortness of breath.  HPI: Sheryl Aguilar is a 77 y.o. female with medical history significant for severe morbid obesity with BMI of 56, COPD, OSA on CPAP, chronic hypoxia on 3 L nasal cannula continuously, chronic HFrEF, permanent atrial fibrillation unclear if she is still taking OAC, recently discharged from outside facility 10/28/22 after 6 weeks hospital stay at [facility].  She was treated for pneumonia.  Her hospital stay was complicated by GI bleed, for which she received blood transfusion and had an EGD.  Was also complicated by HCAP and fluid overload she was treated for HCAP and she had a thoracentesis.  The patient was DC'd 2 days ago to home with home health PT OT.  Since she returned home she has not been able to stand without assistance.  Also unable to transfer without assistance.  Reports feeling generally weak.  Associated with shortness of breath with minimal exertion.   EMS was activated and the patient was brought into the ED for further evaluation.  In the ED, her O2 requirement was increased to 4 L from 3 L at baseline.  Chest x-ray revealed cardiomegaly and pulmonary edema.  UA was positive for pyuria.  The patient was started on empiric IV antibiotics and IV Lasix was initiated in the ED.  The patient was admitted by [facility], hospitalist service, to telemetry medical unit as inpatient status.  ED Course: Temperature 98.7.  BP 101/47, pulse 70, respiratory rate 20, saturation 99% on 3 L.  Lab studies notable for WBC 5.2, hemoglobin 8.7.  Serum glucose 171, BUN 34, creatinine 1.42, GFR 38, BNP 455.,  High-sensitivity troponin negative 15.  Review of Systems: Review of systems as noted in the HPI. All other systems  reviewed and are negative.   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 She has never used smokeless tobacco. She reports that she does not currently use alcohol. She reports that she does not currently use drugs.   Allergies  Allergen Reactions   Filgrastim Swelling and Shortness Of Breath    Hypotension.    Sargramostim Shortness Of Breath    Family History  Problem Relation Age of Onset   Stroke Mother    Hypertension Mother    Diabetes Mother    Cancer Mother    Heart disease Father    COPD Father    Cancer Paternal Grandfather       Prior to Admission medications   Medication Sig Start Date End Date Taking? Authorizing Provider  apixaban (ELIQUIS) 5 MG TABS tablet Take 5 mg by mouth 2 (two) times daily.    [provider]  carvedilol (COREG) 12.5 MG  tablet Take 12.5 mg by mouth 2 (two) times daily.    [provider]  cholecalciferol (VITAMIN D) 25 MCG (1000 UNIT) tablet Take 1,000 Units by mouth daily.    [provider]  DULoxetine (CYMBALTA) 60 MG capsule Take 60 mg by mouth daily.    [provider]  furosemide (LASIX) 20 MG tablet Take 20 mg by mouth 2 (two) times daily.    [provider]  gabapentin (NEURONTIN) 300 MG capsule Take 300 mg by mouth 2 (two) times daily. May take 1 additional capsule at bedtime as needed for sleep    [provider]  metFORMIN (GLUCOPHAGE) 1000 MG tablet Take 1,000 mg by mouth 2 (two) times daily with a meal.     [provider]  oxyCODONE-acetaminophen (PERCOCET) 7.5-325 MG tablet Take 1 tablet by mouth every 4 (four) hours as needed for severe pain.    [provider]  Semaglutide,0.25 or 0.5MG /DOS, 2 MG/1.5ML SOPN Inject 0.5 mg into the skin See admin instructions. Inject 1mL into the skin once weekly every Friday 05/01/20   [provider]    Physical Exam: BP 135/66   Pulse 93   Temp 98.9 F (37.2 C) (Oral)   Resp 18   Ht 5\' 4"  (1.626 m)   Wt (!) 149.7 kg   SpO2 94%   BMI 56.64 kg/m   General: 77 y.o. year-old female well developed well nourished in no acute distress.  Alert and oriented x3. Cardiovascular: Regular rate and rhythm with no rubs or gallops.  No thyromegaly or JVD noted.  1+ pitting edema in lower extremities bilaterally. Respiratory: Mild rales at bases bilaterally.  Poor inspiratory effort. Abdomen: Soft nontender nondistended with normal bowel sounds x4 quadrants. Muskuloskeletal: No cyanosis, clubbing or edema noted bilaterally Neuro: CN II-XII intact, strength, sensation, reflexes Skin: No ulcerative lesions noted or rashes Psychiatry: Judgement and insight appear normal. Mood is appropriate for condition and setting          Labs on Admission:  Basic Metabolic Panel: Recent Labs  Lab 10/29/22 2304  NA 140  K 4.1  CL 96*  CO2 32  GLUCOSE 171*  BUN 34*  CREATININE 1.42*  CALCIUM 9.1   Liver Function Tests: No results for input(s): "AST", "ALT", "ALKPHOS", "BILITOT", "PROT", "ALBUMIN" in the last 168 hours. No results for input(s): "LIPASE", "AMYLASE" in the last 168 hours. No results for input(s): "AMMONIA" in the last 168 hours. CBC: Recent Labs  Lab 10/29/22 2304  WBC 5.2  NEUTROABS 3.6  HGB 8.7*  HCT 29.0*  MCV 100.3*  PLT 180   Cardiac Enzymes: No results for input(s): "CKTOTAL", "CKMB", "CKMBINDEX", "TROPONINI" in the last 168 hours.  BNP (last 3 results) Recent Labs    10/29/22 2304  BNP 455.6*    ProBNP  (last 3 results) No results for input(s): "PROBNP" in the last 8760 hours.  CBG: No results for input(s): "GLUCAP" in the last 168 hours.  Radiological Exams on Admission: DG Chest Port 1 View  Result Date: 10/29/2022 CLINICAL DATA:  Shortness of breath EXAM: PORTABLE CHEST 1 VIEW COMPARISON:  Chest x-ray 10/07/2022 FINDINGS: The heart is enlarged. There central pulmonary vascular congestion. There are interstitial and hazy opacities throughout the right mid and lower lung and left lower lung. No pleural effusion or pneumothorax. No acute fractures. IMPRESSION: 1. Cardiomegaly with central pulmonary vascular congestion. 2. Interstitial and hazy opacities throughout the right mid and lower lung and left lower lung, which may represent  asymmetric edema or infection. Electronically Signed   By: Darliss Cheney M.D.   On: 10/29/2022 23:00    EKG: I independently viewed the EKG done and my findings are as followed: Atrial fibrillation rate of 91.  Nonspecific ST-T changes.  QTc 459.  Assessment/Plan Present on Admission: **None**  Principal Problem:   Generalized weakness  Generalized weakness after prolonged hospitalization, POA Treat underlying conditions UA positive for pyuria Chest x-ray showing cardiomegaly and pulmonary edema Rocephin and IV Lasix started in the ED, continue. PT OT assessment Fall precautions TOC consulted to assist with disposition  Acute on chronic HFrEF Elevated BNP greater than 400, cardiomegaly/pulmonary edema on chest x-ray, bilateral lower extremity edema Resume home cardiac medications as blood pressure allows. Currently blood pressures are soft IV diuresing when blood pressure allows. Start strict I's and O's and daily weight.  Permanent atrial fibrillation Unclear if she is still taking DOAC Med reconciliation is pending and no family members at bedside. Monitor on telemetry  Presumed UTI, POA Urinary retention, POA Presented with a Foley catheter  in place UA positive for pyuria Started on Rocephin empirically in the ED Monitor cultures.  Type 2 diabetes with hyperglycemia Hold off home oral hypoglycemic Heart healthy carb modified diet. Obtain hemoglobin A1c Start insulin sliding scale.  Diabetic polyneuropathy Resume home gabapentin  Chronic anxiety/depression Resume home Cymbalta   Time: 75 minutes.    DVT prophylaxis: Subcu Lovenox daily  Code Status: DNR form in the room.  Family Communication: None at bedside  Disposition Plan: Admitted to telemetry medical unit  Consults called: None.  Admission status: Inpatient status.   Status is: Inpatient The patient requires at least 2 midnights for further evaluation and treatment of present condition.   Darlin Drop MD [facility] Pager [phone]  If 7PM-7AM, please contact night-coverage [email or web address] Password [facility]  10/30/2022, 3:43 AM

## 2022-10-30 NOTE — Evaluation (Signed)
Physical Therapy Evaluation Patient Details Name: Sheryl Aguilar MRN: [id number] DOB: 07/22/1945 Today's Date: 10/30/2022  History of Present Illness  77 y.o. female admitted 7/10 with SOB, weakness. PMHx: pt D/Cd from [facility] 10/28/22 after 6 week stay for PNA and GIB. Severe morbid obesity, COPD, OSA on CPAP, chronic hypoxia on 3 L, HFrEF, Afib, chronic pain, breast CA  Clinical Impression  Pt pleasant and reports living  at home with daughter. Pt normally can transfer to [facility] without assist and propel WC however, since recent admission at [facility] has become weak and was unable to stand since that admission and could not get OOB to [facility] when she returned home. Pt able to stand with stedy today with 2 person assist and wants to return to independent transfers and being able to stand pivot. Pt states she did not want rehab after last admission but given deficits would now like to pursue before returning home. Pt with decreased strength, function, and balance who will benefit from acute therapy to maximize mobility, safety and independence to decrease burden of care. Patient will benefit from continued inpatient follow up therapy, <3 hours/day       Assistance Recommended at Discharge Frequent or constant Supervision/Assistance  If plan is discharge home, recommend the following:  Can travel by private vehicle  A lot of help with walking and/or transfers;A lot of help with bathing/dressing/bathroom;Assistance with cooking/housework;Direct supervision/assist for medications management;Assist for transportation;Help with stairs or ramp for entrance   No    Equipment Recommendations None recommended by PT  Recommendations for Other Services       Functional Status Assessment Patient has had a recent decline in their functional status and/or demonstrates limited ability to make significant improvements in function in a reasonable and predictable amount of time     Precautions / Restrictions  Precautions Precautions: Fall Restrictions Weight Bearing Restrictions: No      Mobility  Bed Mobility Overal bed mobility: Needs Assistance Bed Mobility: Supine to Sit     Supine to sit: Min assist, HOB elevated     General bed mobility comments: HOB 20 degrees with assist to fully pivot legs to EOB and elevate trunk    Transfers Overall transfer level: Needs assistance   Transfers: Sit to/from Stand, Bed to chair/wheelchair/BSC Sit to Stand: Mod assist, +2 physical assistance           General transfer comment: Assist for boost up from elevated EOB and chair with pad to stedy. From stedy pads no physical assist needed. Pt unable to achieve fully upright position with flexed hip/trunk. Cues for safety and sequence. Bed to chair via stedy Transfer via Lift Equipment: Stedy  Ambulation/Gait                  Stairs            Wheelchair Mobility     Tilt Bed    Modified Rankin (Stroke Patients Only)       Balance Overall balance assessment: Needs assistance Sitting-balance support: No upper extremity supported, Feet supported, Bilateral upper extremity supported Sitting balance-Leahy Scale: Fair     Standing balance support: Bilateral upper extremity supported, Reliant on assistive device for balance Standing balance-Leahy Scale: Poor Standing balance comment: reliance on stedy bar                             Pertinent Vitals/Pain Pain Assessment Faces Pain Scale: Hurts little more  Pain Location: Bil ankles Pain Descriptors / Indicators: Sore Pain Intervention(s): Limited activity within patient's tolerance, Repositioned, Monitored during session    Home Living Family/patient expects to be discharged to:: Private residence Living Arrangements: Children Available Help at Discharge: Family;Available PRN/intermittently Type of Home: Apartment Home Access: Elevator (takes elevator to 4th floor)       Home Layout: One  level Home Equipment: Adaptive equipment;Hand held shower head;Hospital bed;Wheelchair - manual;Tub bench;BSC/3in1 (hoyer but does not have sling)      Prior Function Prior Level of Function : Needs assist             Mobility Comments: scooting transfers bed>chair/shower seat until recent hospitalization at [facility]; was unable to get OOB after return home. Last stood in April ADLs Comments: prior to hospital admission last week, was doing her own bathing and dressing per pt, but has been total A for all LB ADL while hospitalized     Hand Dominance        Extremity/Trunk Assessment   Upper Extremity Assessment Upper Extremity Assessment: Generalized weakness    Lower Extremity Assessment Lower Extremity Assessment: Generalized weakness    Cervical / Trunk Assessment Cervical / Trunk Assessment: Kyphotic;Other exceptions Cervical / Trunk Exceptions: increased body habitus/ sacral shelf  Communication   Communication: No difficulties  Cognition Arousal/Alertness: Awake/alert Behavior During Therapy: WFL for tasks assessed/performed Overall Cognitive Status: Impaired/Different from baseline Area of Impairment: Safety/judgement, Awareness, Problem solving                     Memory: Decreased short-term memory   Safety/Judgement: Decreased awareness of safety   Problem Solving: Slow processing, Requires verbal cues General Comments: pt changing her reports of PLOF a few times with minor changes        General Comments      Exercises General Exercises - Lower Extremity Long Arc Quad: AROM, Both, Seated, 10 reps Hip Flexion/Marching: AROM, Both, Seated, 10 reps   Assessment/Plan    PT Assessment Patient needs continued PT services  PT Problem List Decreased strength;Decreased mobility;Decreased activity tolerance;Decreased balance;Decreased knowledge of use of DME;Obesity       PT Treatment Interventions Functional mobility training;Balance  training;Patient/family education;Therapeutic exercise;Therapeutic activities;DME instruction    PT Goals (Current goals can be found in the Care Plan section)  Acute Rehab PT Goals Patient Stated Goal: go to rehab then return home PT Goal Formulation: With patient Time For Goal Achievement: 11/13/22 Potential to Achieve Goals: Good    Frequency Min 2X/week     Co-evaluation PT/OT/SLP Co-Evaluation/Treatment: Yes Reason for Co-Treatment: For patient/therapist safety;To address functional/ADL transfers PT goals addressed during session: Mobility/safety with mobility;Proper use of DME;Strengthening/ROM OT goals addressed during session: ADL's and self-care       AM-PAC PT "6 Clicks" Mobility  Outcome Measure Help needed turning from your back to your side while in a flat bed without using bedrails?: A Little Help needed moving from lying on your back to sitting on the side of a flat bed without using bedrails?: A Little Help needed moving to and from a bed to a chair (including a wheelchair)?: A Lot Help needed standing up from a chair using your arms (e.g., wheelchair or bedside chair)?: A Lot Help needed to walk in hospital room?: Total Help needed climbing 3-5 steps with a railing? : Total 6 Click Score: 12    End of Session Equipment Utilized During Treatment: Oxygen Activity Tolerance: Patient tolerated treatment well Patient left: in  chair;with call bell/phone within reach;with chair alarm set;with nursing/sitter in room Nurse Communication: Mobility status;Need for lift equipment PT Visit Diagnosis: Other abnormalities of gait and mobility (R26.89);Muscle weakness (generalized) (M62.81)    Time: 9604-5409 PT Time Calculation (min) (ACUTE ONLY): 21 min   Charges:   PT Evaluation $PT Eval Moderate Complexity: 1 Mod   PT General Charges $$ ACUTE PT VISIT: 1 Visit         Merryl Hacker, PT Acute Rehabilitation Services Office: [phone]   Jacci Ruberg B  Kaiel Weide 10/30/2022, 11:08 AM

## 2022-10-30 NOTE — Progress Notes (Signed)
TRH night cross cover note:   I was notified by RN that this patient, who was admitted overnight, has an existing Foley catheter that was present prior to arrival. I subsequently placed order to maintain existing Foley catheter.    Newton Pigg, DO Hospitalist

## 2022-10-31 DIAGNOSIS — N1832 Chronic kidney disease, stage 3b: Secondary | ICD-10-CM | POA: Insufficient documentation

## 2022-10-31 DIAGNOSIS — G4733 Obstructive sleep apnea (adult) (pediatric): Secondary | ICD-10-CM | POA: Insufficient documentation

## 2022-10-31 DIAGNOSIS — E785 Hyperlipidemia, unspecified: Secondary | ICD-10-CM | POA: Insufficient documentation

## 2022-10-31 DIAGNOSIS — E119 Type 2 diabetes mellitus without complications: Secondary | ICD-10-CM

## 2022-10-31 DIAGNOSIS — N3000 Acute cystitis without hematuria: Secondary | ICD-10-CM | POA: Diagnosis not present

## 2022-10-31 DIAGNOSIS — I5022 Chronic systolic (congestive) heart failure: Secondary | ICD-10-CM | POA: Insufficient documentation

## 2022-10-31 DIAGNOSIS — E662 Morbid (severe) obesity with alveolar hypoventilation: Secondary | ICD-10-CM | POA: Insufficient documentation

## 2022-10-31 DIAGNOSIS — J449 Chronic obstructive pulmonary disease, unspecified: Secondary | ICD-10-CM | POA: Insufficient documentation

## 2022-10-31 LAB — CBC
HCT: 28.3 % — ABNORMAL LOW (ref 36.0–46.0)
Hemoglobin: 8.4 g/dL — ABNORMAL LOW (ref 12.0–15.0)
MCH: 30.9 pg (ref 26.0–34.0)
MCHC: 29.7 g/dL — ABNORMAL LOW (ref 30.0–36.0)
MCV: 104 fL — ABNORMAL HIGH (ref 80.0–100.0)
Platelets: 168 10*3/uL (ref 150–400)
RBC: 2.72 MIL/uL — ABNORMAL LOW (ref 3.87–5.11)
RDW: 18.8 % — ABNORMAL HIGH (ref 11.5–15.5)
WBC: 5.4 10*3/uL (ref 4.0–10.5)
nRBC: 0.4 % — ABNORMAL HIGH (ref 0.0–0.2)

## 2022-10-31 LAB — BASIC METABOLIC PANEL
Anion gap: 6 (ref 5–15)
BUN: 40 mg/dL — ABNORMAL HIGH (ref 8–23)
CO2: 31 mmol/L (ref 22–32)
Calcium: 8.5 mg/dL — ABNORMAL LOW (ref 8.9–10.3)
Chloride: 100 mmol/L (ref 98–111)
Creatinine, Ser: 1.61 mg/dL — ABNORMAL HIGH (ref 0.44–1.00)
GFR, Estimated: 33 mL/min — ABNORMAL LOW (ref >60)
Glucose, Bld: 155 mg/dL — ABNORMAL HIGH (ref 70–99)
Potassium: 4.6 mmol/L (ref 3.5–5.1)
Sodium: 137 mmol/L (ref 135–145)

## 2022-10-31 LAB — URINE CULTURE: Culture: >=100000 — AB

## 2022-10-31 LAB — GLUCOSE, CAPILLARY
Glucose-Capillary: 135 mg/dL — ABNORMAL HIGH (ref 70–99)
Glucose-Capillary: 127 mg/dL — ABNORMAL HIGH (ref 70–99)
Glucose-Capillary: 106 mg/dL — ABNORMAL HIGH (ref 70–99)
Glucose-Capillary: 116 mg/dL — ABNORMAL HIGH (ref 70–99)
Glucose-Capillary: 152 mg/dL — ABNORMAL HIGH (ref 70–99)

## 2022-10-31 LAB — HEMOGLOBIN A1C
Hgb A1c MFr Bld: 6.1 % — ABNORMAL HIGH (ref 4.8–5.6)
Mean Plasma Glucose: 128 mg/dL

## 2022-10-31 MED ORDER — CARVEDILOL 12.5 MG PO TABS: 12.5000 mg | ORAL_TABLET | Freq: Two times a day (BID) | ORAL | Status: DC

## 2022-10-31 MED ORDER — CARVEDILOL 12.5 MG PO TABS
12.5000 mg | ORAL_TABLET | Freq: Two times a day (BID) | ORAL | Status: DC
  Administered 2022-10-31 – 2022-11-10 (×21): 12.5 mg via ORAL
  Filled 2022-10-31 (×21): qty 1

## 2022-10-31 MED ORDER — IPRATROPIUM-ALBUTEROL 0.5-2.5 (3) MG/3ML IN SOLN: 3.0000 mL | RESPIRATORY_TRACT | Status: DC | PRN

## 2022-10-31 NOTE — Progress Notes (Signed)
   10/31/22 0010  BiPAP/CPAP/SIPAP  Reason BIPAP/CPAP not in use Non-compliant   Patient says she does not weat a cpap or bipap at home and does not want to wear one tonight.

## 2022-10-31 NOTE — Progress Notes (Signed)
Heart Failure Navigator Progress Note  Assessed for Heart & Vascular TOC clinic readiness.  Patient does not meet criteria due to per MD note Chronic CHF with no exacerbation. No HF TOC. .   Navigator will sign off at this time.   Rhae Hammock, BSN, Scientist, clinical (histocompatibility and immunogenetics) Only

## 2022-10-31 NOTE — Progress Notes (Signed)
Mobility Specialist Progress Note:    10/31/22 1148  Mobility  Activity Transferred from bed to chair  Level of Assistance +2 (takes two people)  Assistive Device Stedy  Activity Response Tolerated well  Mobility Referral Yes  $Mobility charge 1 Mobility  Mobility Specialist Start Time (ACUTE ONLY) 1035  Mobility Specialist Stop Time (ACUTE ONLY) 1100  Mobility Specialist Time Calculation (min) (ACUTE ONLY) 25 min   Pt received in bed, agreeable to transfer to chair. Pt was standby assist w/ bed mobility and ModA +2 w/ STS. Was able to transfer pt from bed to chair w/o fault. Transfer on 3.5L/min, VSS. Pt situated in chair, call bell in hand, chair alarm on. All needs met.   Thompson Grayer Mobility Specialist  Please contact vis Secure Chat or  Rehab Office [phone]

## 2022-10-31 NOTE — Progress Notes (Signed)
Patient refused CPAP machine for tonight. Nurses Elease Hashimoto and Celestial Barnfield encourage patient to wear CPAP even for a portion of the night and the importance/indications but patient refused.  Sheryl Aguilar

## 2022-10-31 NOTE — TOC Initial Note (Addendum)
Transition of Care [facility]) - Initial/Assessment Note    Patient Details  Name: Sheryl Aguilar MRN: [id number] Date of Birth: [birth year]  [facility]) CM/SW Contact:    Janae Bridgeman, RN Phone Number: 10/31/2022, 4:59 PM  Clinical Narrative:                 CM met with the patient at the bedside to discuss TOC needs.  I called the patient's daughter on the phone to discuss TOC needs for the home as well.  The patient and daughter are agreeable for patient to admit to [facility] SN[facility] and daughter states that she is pretty sure that pay may have Medicare days left for rehab available.  The daughter needed help navigating DME issues as well.  The patient has hospital bed with faulty mattress and has a new Bipap machine provided through [employer] that is missing tubing/ connections to function properly.  I called [employer] and Jermaine, CM states that he will reach out to the daughter to send a tech out to the home to replace the hospital bed mattress and provide missing pieces to the Bipap.  Daughter is aware in our conversation that I would reach out to [employer].  Other DME in the home includes home oxygen (unknown company per patient), shower bench, wheelchair and Rolator. [employer] is available to the daughter by private pay and I made her aware that Medicare/Medicaid does not pay for this. I will provide a flyer to the daughter on Monday if she is still here in the hospital.  The daughter states that patient was active in past with [facility] home [facility] - but not recently.  I updated the daughter that PCP could follow up at some point and provide the patient with Personal Care Services through [facility] - this would be set up as outpatient once patient is discharged from the providing SNF facility.  TOC Team  - MSW will follow up to fax patient out for availability for rehab  placement at [facility] - patient and daughter prefer this facility if possible.  Expected Discharge Plan:  Skilled Nursing Facility Barriers to Discharge: Continued Medical Work up   Patient Goals and CMS Choice Patient states their goals for this hospitalization and ongoing recovery are:: Agreeable to Rehab/ SNF CMS Medicare.gov Compare Post Acute Care list provided to:: Patient Represenative (must comment) Choice offered to / list presented to : Adult Children [place] ownership interest in [facility].provided to:: Adult Children    Expected Discharge Plan and Services In-house Referral: Clinical Social Work Discharge Planning Services: CM Consult Post Acute Care Choice: Skilled Nursing Facility Living arrangements for the past 2 months: Apartment                   DME Agency: [employer] Date DME Agency Contacted: 10/31/22 Time DME Agency Contacted: 1650 Representative spoke with at DME Agency: Vaughan Basta, CM with [employer]            Prior Living Arrangements/Services Living arrangements for the past 2 months: Apartment Lives with:: Adult Children Patient language and need for interpreter reviewed:: Yes Do you feel safe going back to the place where you live?: Yes      Need for Family Participation in Patient Care: Yes (Comment) Care giver support system in place?: Yes (comment) Current home services: DME (Home oxygen, Bipap machine thru [employer], Hospital bed through [employer], tub bench, WC, Rolator, glucometer) Criminal Activity/Legal Involvement Pertinent to Current Situation/Hospitalization: No - Comment  as needed  Activities of Daily Living Home Assistive Devices/Equipment: Oxygen, Dentures (specify type), BIPAP ADL Screening (condition at time of admission) Patient's cognitive ability adequate to safely complete daily activities?: Yes Is the patient deaf or have difficulty hearing?: No Does the patient have difficulty seeing, even when wearing glasses/contacts?: No Does the patient have difficulty concentrating, remembering, or making decisions?:  No Patient able to express need for assistance with ADLs?: Yes Does the patient have difficulty dressing or bathing?: No Independently performs ADLs?: No Communication: Appropriate for developmental age, Needs assistance Is this a change from baseline?: Pre-admission baseline Dressing (OT): Appropriate for developmental age Grooming: Appropriate for developmental age Feeding: Independent Bathing: Appropriate for developmental age Toileting: Needs assistance Is this a change from baseline?: Pre-admission baseline In/Out Bed: Needs assistance Walks in Home: Needs assistance Is this a change from baseline?: Pre-admission baseline Does the patient have difficulty walking or climbing stairs?: Yes Weakness of Legs: Both Weakness of Arms/Hands: None  Permission Sought/Granted Permission sought to share information with : Case Manager, Magazine features editor, Family Supports Permission granted to share information with : Yes, Verbal Permission Granted     Permission granted to share info w AGENCY: [facility]  Permission granted to share info w Relationship: Jodelle Green, daughter - [phone]     Emotional Assessment Appearance:: Appears stated age Attitude/Demeanor/Rapport: Gracious Affect (typically observed): Accepting Orientation: : Oriented to Self, Oriented to Place, Oriented to  Time, Oriented to Situation Alcohol / Substance Use: Not Applicable Psych Involvement: No (comment)  Admission diagnosis:  Shortness of breath [R06.02] Acute pulmonary edema (HCC) [J81.0] Generalized weakness [R53.1] Urinary tract infection with hematuria, site unspecified [N39.0, R31.9] Patient Active Problem List   Diagnosis Date Noted   CKD stage 3b, GFR 30-44 ml/min (HCC) 10/31/2022   Controlled type 2 diabetes mellitus without complication, without long-term current use of insulin (HCC) 10/31/2022   Hyperlipidemia 10/31/2022   Chronic systolic CHF (congestive heart failure)  (HCC) 10/31/2022   COPD (chronic obstructive pulmonary disease) (HCC) 10/31/2022   OSA (obstructive sleep apnea) 10/31/2022   Obesity hypoventilation syndrome (HCC) 10/31/2022   Morbid obesity with BMI of 50.0-59.9, adult (HCC) 10/31/2022   Generalized weakness 10/30/2022   UTI (urinary tract infection) 01/23/2021   COVID-19 virus infection 01/23/2021   AKI (acute kidney injury) (HCC) 01/23/2021   Chronic respiratory failure with hypoxia (HCC) 01/23/2021   A-fib (HCC) 01/23/2021   Chronic pain 01/23/2021   PCP:  Andreas Blower., MD Pharmacy:   [facility] [phone] - [place], [place] [address] AT [facility] [address] [place] [address] Phone: [phone] Fax: [phone]     Social Determinants of Health (SDOH) Social History: SDOH Screenings   Food Insecurity: No Food Insecurity (10/30/2022)  Housing: Patient Declined (10/30/2022)  Transportation Needs: No Transportation Needs (10/30/2022)  Utilities: Not At Risk (10/30/2022)  Financial Resource Strain: Medium Risk (03/17/2022)   [facility]  Physical Activity: Inactive (03/17/2022)   [facility]  Social Connections: Socially Isolated (03/17/2022)   [facility]  Stress: No Stress Concern Present (03/17/2022)   [facility]  Tobacco Use: Low Risk  (10/29/2022)  Recent  Concern: Tobacco Use - Medium Risk (10/24/2022)   [facility]   SDOH Interventions:     Readmission Risk Interventions    10/31/2022    4:52 PM  Readmission Risk Prevention Plan  Transportation Screening Complete  PCP or  Specialist Appt within 5-7 Days Complete  Home Care Screening Complete  Medication Review (RN CM) Complete

## 2022-10-31 NOTE — Plan of Care (Signed)
  Problem: Nutritional: Goal: Maintenance of adequate nutrition will improve Outcome: Progressing   Problem: Skin Integrity: Goal: Risk for impaired skin integrity will decrease Outcome: Progressing   Problem: Education: Goal: Knowledge of General Education information will improve Description: Including pain rating scale, medication(s)/side effects and non-pharmacologic comfort measures Outcome: Progressing   Problem: Activity: Goal: Risk for activity intolerance will decrease Outcome: Progressing   Problem: Safety: Goal: Ability to remain free from injury will improve Outcome: Progressing

## 2022-10-31 NOTE — TOC Progression Note (Addendum)
Transition of Care [facility]) - Progression Note    Patient Details  Name: Sheryl Aguilar MRN: [id number] Date of Birth: [birth year]  Transition of Care [facility]) CM/SW Contact  Jeremih Dearmas A [place], [place] Phone Number: 10/31/2022, 11:30 AM  Clinical Narrative:     CSW met with pt and contacted pt's daughter in the room. CSW provided bed offers.  CSW informed daughter that pt was medically stable for discharge and if facility had bed availability, it was possible for a discharge today but more likely Monday. Daughter became irritated and said that she  she did not want to talk with "the case manger" and to leave her the "f" alone." CSW informed her that explicit language is not tolerated on the phone and would no longer speak to pt and proceeded to end phone call.   Pt apologized for daughter's behavior and told CSW that she would like to go to [facility], the preferred facility that did provide bed offer. Patient is alert and oriented enough to make her own decisions. CSW will proceed with SNF placement. CSW will update treatment team regarding bed availability and start insurance authorization. Discharge tentatively scheduled for Monday.   TOC will continue to follow.   Expected Discharge Plan: Skilled Nursing Facility Barriers to Discharge: Continued Medical Work up, SNF Pending bed offer, English as a second language teacher  Expected Discharge Plan and Services In-house Referral: Clinical Social Work   Post Acute Care Choice: Skilled Nursing Facility Living arrangements for the past 2 months: Single Family Home                                       Social Determinants of Health (SDOH) Interventions SDOH Screenings   Food Insecurity: No Food Insecurity (10/30/2022)  Housing: Patient Declined (10/30/2022)  Transportation Needs: No Transportation Needs (10/30/2022)  Utilities: Not At Risk (10/30/2022)  Financial Resource Strain: Medium Risk (03/17/2022)   [facility]  Physical Activity: Inactive (03/17/2022)   [facility]  Social Connections: Socially Isolated (03/17/2022)   [facility]  Stress: No Stress Concern Present (03/17/2022)   [facility]  Tobacco Use: Low Risk  (10/29/2022)  Recent Concern: Tobacco Use - Medium Risk (10/24/2022)   [facility]    Readmission Risk Interventions     No data to display

## 2022-10-31 NOTE — Progress Notes (Signed)
PROGRESS NOTE    Sheryl Aguilar  WUJ:[id number] DOB: 09-Aug-1945 DOA: 10/29/2022 PCP: Andreas Blower., MD     Brief Narrative:  Sheryl Aguilar is a 77 y.o. female with medical history significant for severe morbid obesity with BMI of 56, COPD, OSA on CPAP, chronic hypoxia on 3 L nasal cannula continuously, chronic HFrEF, permanent atrial fibrillation on eliquis, recently discharged from outside facility 10/28/22 after 6 weeks hospital stay at [facility].  She was treated for pneumonia.  Her hospital stay was complicated by GI bleed, for which she received blood transfusion and had an EGD.  Was also complicated by HCAP and fluid overload she was treated for HCAP and she had a thoracentesis.  The patient was discharged home on 7/9, due to payment issues for SNF placement.  At home, she was unable to get from bed to commode and remained extremely weak.  She ended up back at [facility].   She was diagnosed with CAUTI.  Foley catheter was removed and patient was started on Rocephin.  PT evaluated patient and recommended for SNF placement.  New events last 24 hours / Subjective: Patient is feeling well this morning.  Ate breakfast.  States that she is feeling a lot better, remains very weak.  We had a discussion regarding her refusal for BiPAP last night.  She was ordered to use BiPAP when she was released from [facility], and was sent home with BiPAP machine.  Unfortunately, her home machine was missing a piece/did not work correctly.  I encouraged patient to not turn away BiPAP use as we are trying to avoid further medical complications and respiratory failure.  She stated that she will try BiPAP again and if it does not fit correctly, she would let respiratory therapist know so they can troubleshoot the issue.  She needs to be on BiPAP every night.  We also discussed importance of her nutrition, regular bowel movements and continued work with physical therapy.  She is  agreeable to SNF placement.  I discussed with TOC today.  Assessment & Plan:   Principal Problem:   UTI (urinary tract infection) Active Problems:   Chronic respiratory failure with hypoxia (HCC)   A-fib (HCC)   Generalized weakness   CKD stage 3b, GFR 30-44 ml/min (HCC)   Controlled type 2 diabetes mellitus without complication, without long-term current use of insulin (HCC)   Hyperlipidemia   Chronic systolic CHF (congestive heart failure) (HCC)   COPD (chronic obstructive pulmonary disease) (HCC)   OSA (obstructive sleep apnea)   Obesity hypoventilation syndrome (HCC)   Morbid obesity with BMI of 50.0-59.9, adult (HCC)   CAUTI, present on admission -Foley catheter was removed -Urine culture is pending, currently showing gram-negative rod -Empiric Rocephin -Dysuria is improving  Generalized weakness and debility -Following significant hospital stay greater than 30 days at an outside facility -Continue PT OT, recommending SNF placement  CKD stage 3B -Baseline creatinine 1.4 -Slightly trended up today, continue to monitor closely  Diabetes mellitus type 2, well-controlled -A1c 6.1 -Sliding scale insulin  A-fib -Eliquis, Coreg  Hyperlipidemia -Lipitor  Chronic systolic CHF -Without exacerbation -On Lasix  Chronic hypoxemic respiratory failure -Uses 3 L nasal cannula at baseline  COPD -Without exacerbation  OSA/OHS  -BiPAP nightly  Morbid obesity -Body mass index is 51.49 kg/m.  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 Follow up ultrasound is recommended   DVT prophylaxis:  apixaban (ELIQUIS) tablet 5 mg  Code Status: DNR Family Communication: Updated daughter yesterday as well as today over the phone Disposition Plan: SNF Status is: Inpatient Remains inpatient appropriate because: Remains on IV antibiotics, SNF placement is pending    Antimicrobials:  Anti-infectives (From admission, onward)    Start     Dose/Rate Route Frequency Ordered Stop   10/30/22 2200  cefTRIAXone (ROCEPHIN) 2 g in sodium chloride 0.9 % 100 mL IVPB        2 g 200 mL/hr over 30 Minutes Intravenous Every 24 hours 10/30/22 0333     10/30/22 0045  cefTRIAXone (ROCEPHIN) 2 g in sodium chloride 0.9 % 100 mL IVPB        2 g 200 mL/hr over 30 Minutes Intravenous  Once 10/30/22 0039 10/30/22 0230        Objective: Vitals:   10/31/22 0047 10/31/22 0126 10/31/22 0424 10/31/22 0810  BP: (!) 87/39 103/63 109/66 (!) 123/108  Pulse: 74 73 84 82  Resp: 17  18 18   Temp: 98.3 F (36.8 C)  98.5 F (36.9 C) 97.6 F (36.4 C)  TempSrc: Oral  Oral Oral  SpO2: 93%  94% 96%  Weight:   136.1 kg   Height:        Intake/Output Summary (Last 24 hours) at 10/31/2022 1222 Last data filed at 10/31/2022 1045 Gross per 24 hour  Intake 800 ml  Output 200 ml  Net 600 ml   Filed Weights   10/29/22 2158 10/31/22 0424  Weight: (!) 149.7 kg 136.1 kg    Examination:  General exam: Appears calm and comfortable  Respiratory system: Clear to auscultation. Respiratory effort normal. No respiratory distress. No conversational dyspnea.  Cardiovascular system: S1 & S2 heard. No murmurs. No pedal edema. Gastrointestinal system: Abdomen is nondistended, soft and nontender. Normal bowel sounds heard. Central nervous system: Alert and oriented. No focal neurological deficits. Speech clear.   Extremities: Symmetric in appearance  Psychiatry: Judgement and insight appear normal. Mood & affect appropriate.   Data Reviewed: I have personally reviewed following labs and imaging studies  CBC: Recent Labs  Lab 10/29/22 2304 10/30/22 0830 10/31/22 0658  WBC 5.2 3.4* 5.4  NEUTROABS 3.6  --   --   HGB 8.7* 8.4* 8.4*  HCT 29.0* 28.3* 28.3*  MCV 100.3* 100.7* 104.0*  PLT 180 167 168   Basic Metabolic Panel: Recent Labs  Lab 10/29/22 2304 10/30/22 0830 10/31/22 0658  NA 140 139 137  K 4.1 4.4 4.6  CL 96* 102 100  CO2 32 30 31  GLUCOSE 171* 226* 155*  BUN 34* 32* 40*  CREATININE 1.42* 1.48* 1.61*  CALCIUM 9.1 8.7* 8.5*  MG  --  1.7  --   PHOS  --  4.3  --    GFR: Estimated Creatinine Clearance: 40.3 mL/min (A) (by C-G formula based on SCr of 1.61 mg/dL (H)). Liver Function Tests: No results for input(s): "AST", "ALT", "ALKPHOS", "BILITOT", "PROT", "ALBUMIN" in the last 168 hours. No results for input(s): "LIPASE", "AMYLASE"  in the last 168 hours. No results for input(s): "AMMONIA" in the last 168 hours. Coagulation Profile: No results for input(s): "INR", "PROTIME" in the last 168 hours. Cardiac Enzymes: No results for input(s): "CKTOTAL", "CKMB", "CKMBINDEX", "TROPONINI" in the last 168 hours. BNP (last 3 results) No results for input(s): "PROBNP" in the last 8760 hours. HbA1C: Recent Labs    10/30/22 0830  HGBA1C 6.1*   CBG: Recent Labs  Lab 10/30/22 0740 10/30/22 1135 10/30/22 1646 10/30/22 2048 10/31/22 0809  GLUCAP 204* 242* 252* 208* 135*   Lipid Profile: No results for input(s): "CHOL", "HDL", "LDLCALC", "TRIG", "CHOLHDL", "LDLDIRECT" in the last 72 hours. Thyroid Function Tests: No results for input(s): "TSH", "T4TOTAL", "FREET4", "T3FREE", "THYROIDAB" in the last 72 hours. Anemia Panel: No results for input(s): "VITAMINB12", "FOLATE", "FERRITIN", "TIBC", "IRON", "RETICCTPCT" in the last 72 hours. Sepsis Labs: No results for input(s):  "PROCALCITON", "LATICACIDVEN" in the last 168 hours.  Recent Results (from the past 240 hour(s))  Urine Culture     Status: Abnormal (Preliminary result)   Collection Time: 10/29/22 11:16 PM   Specimen: Urine, Clean Catch  Result Value Ref Range Status   Specimen Description URINE, CLEAN CATCH  Final   Special Requests NONE  Final   Culture (A)  Final    >=100,000 COLONIES/mL GRAM NEGATIVE RODS SUSCEPTIBILITIES TO FOLLOW Performed at [facility], [address], [place], [place] [address]    Report Status PENDING  Incomplete      Radiology Studies: DG Chest Port 1 View  Result Date: 10/29/2022 CLINICAL DATA:  Shortness of breath EXAM: PORTABLE CHEST 1 VIEW COMPARISON:  Chest x-ray 10/07/2022 FINDINGS: The heart is enlarged. There central pulmonary vascular congestion. There are interstitial and hazy opacities throughout the right mid and lower lung and left lower lung. No pleural effusion or pneumothorax. No acute fractures. IMPRESSION: 1. Cardiomegaly with central pulmonary vascular congestion. 2. Interstitial and hazy opacities throughout the right mid and lower lung and left lower lung, which may represent asymmetric edema or infection. Electronically Signed   By: Darliss Cheney M.D.   On: 10/29/2022 23:00      Scheduled Meds:  apixaban  5 mg Oral BID   atorvastatin  10 mg Oral Daily   carvedilol  12.5 mg Oral BID WC   DULoxetine  60 mg Oral Daily   furosemide  20 mg Oral BID   insulin aspart  0-20 Units Subcutaneous TID WC   insulin aspart  0-5 Units Subcutaneous QHS   mirtazapine  7.5 mg Oral QHS   Continuous Infusions:  cefTRIAXone (ROCEPHIN)  IV Stopped (10/30/22 2125)     LOS: 1 day   Time spent: 45 minutes including discussion with TOC, daughter, review of outside records  Noralee Stain, DO [facility] 10/31/2022, 12:22 PM   Available via Epic secure chat 7am-7pm After these hours, please refer to coverage provider listed on [email or web address]

## 2022-11-01 DIAGNOSIS — N3000 Acute cystitis without hematuria: Secondary | ICD-10-CM | POA: Diagnosis not present

## 2022-11-01 LAB — GLUCOSE, CAPILLARY
Glucose-Capillary: 111 mg/dL — ABNORMAL HIGH (ref 70–99)
Glucose-Capillary: 134 mg/dL — ABNORMAL HIGH (ref 70–99)
Glucose-Capillary: 130 mg/dL — ABNORMAL HIGH (ref 70–99)
Glucose-Capillary: 142 mg/dL — ABNORMAL HIGH (ref 70–99)

## 2022-11-01 LAB — BASIC METABOLIC PANEL
Anion gap: 9 (ref 5–15)
BUN: 45 mg/dL — ABNORMAL HIGH (ref 8–23)
CO2: 29 mmol/L (ref 22–32)
Calcium: 8.2 mg/dL — ABNORMAL LOW (ref 8.9–10.3)
Chloride: 99 mmol/L (ref 98–111)
Creatinine, Ser: 1.53 mg/dL — ABNORMAL HIGH (ref 0.44–1.00)
GFR, Estimated: 35 mL/min — ABNORMAL LOW (ref >60)
Glucose, Bld: 112 mg/dL — ABNORMAL HIGH (ref 70–99)
Potassium: 4.1 mmol/L (ref 3.5–5.1)
Sodium: 137 mmol/L (ref 135–145)

## 2022-11-01 LAB — URINE CULTURE

## 2022-11-01 MED ORDER — CEFAZOLIN SODIUM-DEXTROSE 1-4 GM/50ML-% IV SOLN
1.0000 g | Freq: Three times a day (TID) | INTRAVENOUS | Status: AC
  Administered 2022-11-01 – 2022-11-05 (×13): 1 g via INTRAVENOUS
  Filled 2022-11-01 (×13): qty 50

## 2022-11-01 NOTE — Progress Notes (Signed)
Pharmacy Antibiotic Note  Sheryl Aguilar is a 77 y.o. female admitted on 10/29/2022 with UTI.  Pharmacy has been consulted for cefazolin dosing for K pneumo UTI. Today is day 3 antibiotics, previously on ceftriaxone with last dose 7/12 @ 20:31. She is afebrile, WBC are normal, and renal function close to baseline.  Plan: Cefazolin 1 g IV q8h - start at 20:00 Stop date entered for 7 days total per [facility] note Pharmacy signing off but will adjust cefazolin dose if needed  Height: 5\' 4"  (162.6 cm) Weight: (!) 139.5 kg (307 lb 8.7 oz) IBW/kg (Calculated) : 54.7  Temp (24hrs), Avg:98.1 F (36.7 C), Min:97.7 F (36.5 C), Max:98.2 F (36.8 C)  Recent Labs  Lab 10/29/22 2304 10/30/22 0830 10/31/22 0658 11/01/22 0630  WBC 5.2 3.4* 5.4  --   CREATININE 1.42* 1.48* 1.61* 1.53*    Estimated Creatinine Clearance: 43.1 mL/min (A) (by C-G formula based on SCr of 1.53 mg/dL (H)).    Allergies  Allergen Reactions   Filgrastim Swelling and Shortness Of Breath    Hypotension.    Sargramostim Shortness Of Breath   Thank you for involving pharmacy in this patient's care.  Loura Back, PharmD, BCPS Clinical Pharmacist Clinical phone for 11/01/2022 is [phone] 11/01/2022 10:58 AM

## 2022-11-01 NOTE — Progress Notes (Signed)
Pharmacist Heart Failure Core Measure Documentation  Assessment: Sheryl Aguilar has an EF documented as 30-35% on 09/22/22 by TTE.  Rationale: Heart failure patients with left ventricular systolic dysfunction (LVSD) and an EF < 40% should be prescribed an angiotensin converting enzyme inhibitor (ACEI) or angiotensin receptor blocker (ARB) at discharge unless a contraindication is documented in the medical record.  This patient is not currently on an ACEI or ARB for HF.  This note is being placed in the record in order to provide documentation that a contraindication to the use of these agents is present for this encounter.  ACE Inhibitor or Angiotensin Receptor Blocker is contraindicated (specify all that apply)  []   ACEI allergy AND ARB allergy []   Angioedema []   Moderate or severe aortic stenosis []   Hyperkalemia [x]   Hypotension []   Renal artery stenosis []   Worsening renal function, preexisting renal disease or dysfunction   Tanuj Mullens I [facility] 11/01/2022 7:44 AM

## 2022-11-01 NOTE — Plan of Care (Signed)

## 2022-11-01 NOTE — Progress Notes (Signed)
   11/01/22 0012  BiPAP/CPAP/SIPAP  Reason BIPAP/CPAP not in use Non-compliant

## 2022-11-01 NOTE — Progress Notes (Signed)
PROGRESS NOTE    Sheryl Aguilar  ZHY:[id number] DOB: [birth year] DOA: 10/29/2022 PCP: Andreas Blower., MD     Brief Narrative:  Sheryl Aguilar is a 77 y.o. female with medical history significant for severe morbid obesity with BMI of 56, COPD, OSA on CPAP, chronic hypoxia on 3 L nasal cannula continuously, chronic HFrEF, permanent atrial fibrillation on eliquis, recently discharged from outside facility 10/28/22 after 6 weeks hospital stay at [facility].  She was treated for pneumonia.  Her hospital stay was complicated by GI bleed, for which she received blood transfusion and had an EGD.  Was also complicated by HCAP and fluid overload she was treated for HCAP and she had a thoracentesis.  The patient was discharged home on 7/9, due to payment issues for SNF placement.  At home, she was unable to get from bed to commode and remained extremely weak.  She ended up back at [facility].   She was diagnosed with CAUTI.  Foley catheter was removed and patient was started on Rocephin.  PT evaluated patient and recommended for SNF placement.  New events last 24 hours / Subjective: Note from overnight nursing that patient declined CPAP use despite encouragement.  When asked patient about it, she is adamant that she did not decline this but stated that she could not wear it because the mask did not fit.  I encouraged patient to try the mask that we have and that way we can try and find one that fits her well.  Otherwise, the patient does not have any physical complaints today.   Assessment & Plan:   Principal Problem:   UTI (urinary tract infection) Active Problems:   Chronic respiratory failure with hypoxia (HCC)   A-fib (HCC)   Generalized weakness   CKD stage 3b, GFR 30-44 ml/min (HCC)   Controlled type 2 diabetes mellitus without complication, without long-term current use of insulin (HCC)   Hyperlipidemia   Chronic systolic CHF (congestive heart failure) (HCC)   COPD  (chronic obstructive pulmonary disease) (HCC)   OSA (obstructive sleep apnea)   Obesity hypoventilation syndrome (HCC)   Morbid obesity with BMI of 50.0-59.9, adult (HCC)   Klebsiella pneumonia CAUTI, present on admission -Foley catheter was removed -Urine culture showing Klebsiella pneumonia, sensitive to cefazolin -Continue cefazolin x 7 days, can de-escalate to Keflex p.o. for discharge  Generalized weakness and debility -Following significant hospital stay greater than 30 days at an outside facility -Continue PT OT, recommending SNF placement.  TOC following for placement  CKD stage 3B -Baseline creatinine 1.4 -Stable today 1.53  Diabetes mellitus type 2, well-controlled -A1c 6.1 -Sliding scale insulin  A-fib -Eliquis, Coreg  Hyperlipidemia -Lipitor  Chronic systolic CHF -Without exacerbation, continue heart healthy diet with fluid restriction -On Lasix  Chronic hypoxemic respiratory failure -Uses 3 L nasal cannula at baseline  COPD -Without exacerbation  OSA/OHS  -BiPAP nightly, encouraged compliance  Morbid obesity -Body mass index is 52.79 kg/m.  Recent hospitalization for GI bleed -S/p EGD at [facility] 7/1: blood streaks in the oropharynx and a duodenal polyp which was biopsied  -S/p colonoscopy 7/5: irritation and oozing at the site of ileocolonic anastamosis s/p hemoclips as well as sub-centimeter descending colon polyps s/p polypectomy and hemoclip placement  -S/p laryngoscopy: normal laryngeal exam with minimal left nasal septal excoriation unlikely to cause significant drop in hemoglobin -Hemoglobin remains stable  Incidental finding need outpatient follow up -Complex cystic lesion w/ calcifications on right side  of thyroid gland measuring 2.7x1.7 cm. Follow up ultrasound is recommended   DVT prophylaxis:  apixaban (ELIQUIS) tablet 5 mg  Code Status: DNR Family Communication: Updated daughter by phone today  Disposition Plan:  SNF Status is: Inpatient Remains inpatient appropriate because: Medically improving.  SNF placement is pending    Antimicrobials:  Anti-infectives (From admission, onward)    Start     Dose/Rate Route Frequency Ordered Stop   11/01/22 2000  ceFAZolin (ANCEF) IVPB 1 g/50 mL premix        1 g 100 mL/hr over 30 Minutes Intravenous Every 8 hours 11/01/22 1045     10/30/22 2200  cefTRIAXone (ROCEPHIN) 2 g in sodium chloride 0.9 % 100 mL IVPB  Status:  Discontinued        2 g 200 mL/hr over 30 Minutes Intravenous Every 24 hours 10/30/22 0333 11/01/22 1029   10/30/22 0045  cefTRIAXone (ROCEPHIN) 2 g in sodium chloride 0.9 % 100 mL IVPB        2 g 200 mL/hr over 30 Minutes Intravenous  Once 10/30/22 0039 10/30/22 0230        Objective: Vitals:   11/01/22 0008 11/01/22 0419 11/01/22 0503 11/01/22 0752  BP: (!) 109/55 100/62  (!) 141/99  Pulse: 85 97  94  Resp: 16 18  18   Temp: 98.2 F (36.8 C) 98.1 F (36.7 C)  97.7 F (36.5 C)  TempSrc: Axillary Oral    SpO2: 94% 93%  93%  Weight:   (!) 139.5 kg   Height:        Intake/Output Summary (Last 24 hours) at 11/01/2022 1047 Last data filed at 11/01/2022 0504 Gross per 24 hour  Intake 340 ml  Output 600 ml  Net -260 ml   Filed Weights   10/29/22 2158 10/31/22 0424 11/01/22 0503  Weight: (!) 149.7 kg 136.1 kg (!) 139.5 kg    Examination:  General exam: Appears calm and comfortable  Respiratory system: Clear to auscultation. Respiratory effort normal. No respiratory distress. No conversational dyspnea.  Cardiovascular system: S1 & S2 heard. No murmurs. No pedal edema. Gastrointestinal system: Abdomen is nondistended, soft and nontender. Normal bowel sounds heard. Central nervous system: Alert and oriented. No focal neurological deficits. Speech clear.  Extremities: Symmetric in appearance  Psychiatry: Judgement and insight appear normal. Mood & affect appropriate.   Data Reviewed: I have personally reviewed following labs  and imaging studies  CBC: Recent Labs  Lab 10/29/22 2304 10/30/22 0830 10/31/22 0658  WBC 5.2 3.4* 5.4  NEUTROABS 3.6  --   --   HGB 8.7* 8.4* 8.4*  HCT 29.0* 28.3* 28.3*  MCV 100.3* 100.7* 104.0*  PLT 180 167 168   Basic Metabolic Panel: Recent Labs  Lab 10/29/22 2304 10/30/22 0830 10/31/22 0658 11/01/22 0630  NA 140 139 137 137  K 4.1 4.4 4.6 4.1  CL 96* 102 100 99  CO2 32 30 31 29   GLUCOSE 171* 226* 155* 112*  BUN 34* 32* 40* 45*  CREATININE 1.42* 1.48* 1.61* 1.53*  CALCIUM 9.1 8.7* 8.5* 8.2*  MG  --  1.7  --   --   PHOS  --  4.3  --   --    GFR: Estimated Creatinine Clearance: 43.1 mL/min (A) (by C-G formula based on SCr of 1.53 mg/dL (H)). Liver Function Tests: No results for input(s): "AST", "ALT", "ALKPHOS", "BILITOT", "PROT", "ALBUMIN" in the last 168 hours. No results for input(s): "LIPASE", "AMYLASE" in the last 168 hours. No results  for input(s): "AMMONIA" in the last 168 hours. Coagulation Profile: No results for input(s): "INR", "PROTIME" in the last 168 hours. Cardiac Enzymes: No results for input(s): "CKTOTAL", "CKMB", "CKMBINDEX", "TROPONINI" in the last 168 hours. BNP (last 3 results) No results for input(s): "PROBNP" in the last 8760 hours. HbA1C: Recent Labs    10/30/22 0830  HGBA1C 6.1*   CBG: Recent Labs  Lab 10/31/22 1226 10/31/22 1538 10/31/22 1631 10/31/22 1940 11/01/22 0751  GLUCAP 127* 106* 116* 152* 111*   Lipid Profile: No results for input(s): "CHOL", "HDL", "LDLCALC", "TRIG", "CHOLHDL", "LDLDIRECT" in the last 72 hours. Thyroid Function Tests: No results for input(s): "TSH", "T4TOTAL", "FREET4", "T3FREE", "THYROIDAB" in the last 72 hours. Anemia Panel: No results for input(s): "VITAMINB12", "FOLATE", "FERRITIN", "TIBC", "IRON", "RETICCTPCT" in the last 72 hours. Sepsis Labs: No results for input(s): "PROCALCITON", "LATICACIDVEN" in the last 168 hours.  Recent Results (from the past 240 hour(s))  Urine Culture      Status: Abnormal   Collection Time: 10/29/22 11:16 PM   Specimen: Urine, Clean Catch  Result Value Ref Range Status   Specimen Description URINE, CLEAN CATCH  Final   Special Requests   Final    NONE Performed at [facility], [address], [place], [place] [address]    Culture >=100,000 COLONIES/mL KLEBSIELLA PNEUMONIAE (A)  Final   Report Status 11/01/2022 FINAL  Final   Organism ID, Bacteria KLEBSIELLA PNEUMONIAE (A)  Final      Susceptibility   Klebsiella pneumoniae - MIC*    AMPICILLIN >=32 RESISTANT Resistant     CEFAZOLIN <=4 SENSITIVE Sensitive     CEFEPIME <=0.12 SENSITIVE Sensitive     CEFTRIAXONE <=0.25 SENSITIVE Sensitive     CIPROFLOXACIN <=0.25 SENSITIVE Sensitive     GENTAMICIN <=1 SENSITIVE Sensitive     IMIPENEM <=0.25 SENSITIVE Sensitive     NITROFURANTOIN 128 RESISTANT Resistant     TRIMETH/SULFA <=20 SENSITIVE Sensitive     AMPICILLIN/SULBACTAM >=32 RESISTANT Resistant     PIP/TAZO 32 INTERMEDIATE Intermediate     * >=100,000 COLONIES/mL KLEBSIELLA PNEUMONIAE      Radiology Studies: No results found.    Scheduled Meds:  apixaban  5 mg Oral BID   atorvastatin  10 mg Oral Daily   carvedilol  12.5 mg Oral BID WC   DULoxetine  60 mg Oral Daily   furosemide  20 mg Oral BID   insulin aspart  0-20 Units Subcutaneous TID WC   insulin aspart  0-5 Units Subcutaneous QHS   mirtazapine  7.5 mg Oral QHS   Continuous Infusions:   ceFAZolin (ANCEF) IV       LOS: 2 days   Time spent: 25 minutes Noralee Stain, DO [facility] 11/01/2022, 10:47 AM   Available via Epic secure chat 7am-7pm After these hours, please refer to coverage provider listed on [email or web address]

## 2022-11-02 DIAGNOSIS — N3000 Acute cystitis without hematuria: Secondary | ICD-10-CM | POA: Diagnosis not present

## 2022-11-02 LAB — GLUCOSE, CAPILLARY
Glucose-Capillary: 121 mg/dL — ABNORMAL HIGH (ref 70–99)
Glucose-Capillary: 147 mg/dL — ABNORMAL HIGH (ref 70–99)
Glucose-Capillary: 125 mg/dL — ABNORMAL HIGH (ref 70–99)
Glucose-Capillary: 145 mg/dL — ABNORMAL HIGH (ref 70–99)

## 2022-11-02 LAB — CBC
HCT: 27.5 % — ABNORMAL LOW (ref 36.0–46.0)
Hemoglobin: 8.2 g/dL — ABNORMAL LOW (ref 12.0–15.0)
MCH: 30.6 pg (ref 26.0–34.0)
MCHC: 29.8 g/dL — ABNORMAL LOW (ref 30.0–36.0)
MCV: 102.6 fL — ABNORMAL HIGH (ref 80.0–100.0)
Platelets: 162 10*3/uL (ref 150–400)
RBC: 2.68 MIL/uL — ABNORMAL LOW (ref 3.87–5.11)
RDW: 19.9 % — ABNORMAL HIGH (ref 11.5–15.5)
WBC: 4.7 10*3/uL (ref 4.0–10.5)
nRBC: 0.6 % — ABNORMAL HIGH (ref 0.0–0.2)

## 2022-11-02 LAB — BASIC METABOLIC PANEL
Anion gap: 7 (ref 5–15)
BUN: 40 mg/dL — ABNORMAL HIGH (ref 8–23)
CO2: 31 mmol/L (ref 22–32)
Calcium: 8.3 mg/dL — ABNORMAL LOW (ref 8.9–10.3)
Chloride: 99 mmol/L (ref 98–111)
Creatinine, Ser: 1.47 mg/dL — ABNORMAL HIGH (ref 0.44–1.00)
GFR, Estimated: 37 mL/min — ABNORMAL LOW (ref >60)
Glucose, Bld: 133 mg/dL — ABNORMAL HIGH (ref 70–99)
Potassium: 4.1 mmol/L (ref 3.5–5.1)
Sodium: 137 mmol/L (ref 135–145)

## 2022-11-02 MED ORDER — NYSTATIN 100000 UNIT/GM EX POWD: Freq: Three times a day (TID) | CUTANEOUS | Status: DC | PRN

## 2022-11-02 MED ORDER — GERHARDT'S BUTT CREAM: TOPICAL_CREAM | Freq: Three times a day (TID) | CUTANEOUS | Status: DC | PRN

## 2022-11-02 MED ORDER — HYDROXYZINE HCL 25 MG PO TABS
25.0000 mg | ORAL_TABLET | Freq: Three times a day (TID) | ORAL | Status: DC | PRN
  Administered 2022-11-02 – 2022-11-06 (×5): 25 mg via ORAL
  Filled 2022-11-02 (×5): qty 1

## 2022-11-02 NOTE — Progress Notes (Signed)
Placed patient on CPAP for the night with oxygen set at 4lpm ° °

## 2022-11-02 NOTE — Plan of Care (Signed)

## 2022-11-02 NOTE — Progress Notes (Signed)
   11/02/22 0028  BiPAP/CPAP/SIPAP  $ Non-Invasive Home Ventilator  Initial  $ Face Mask Medium Yes  BiPAP/CPAP/SIPAP Pt Type Adult  BiPAP/CPAP/SIPAP Resmed  Mask Type Full face mask  Mask Size Medium  EPAP 8 cmH2O  Flow Rate 4 lpm  Patient Home Equipment No  CPAP/SIPAP surface wiped down Yes

## 2022-11-02 NOTE — Progress Notes (Signed)
PROGRESS NOTE    Sheryl Aguilar  ZHY:[id number] DOB: Jun 04, 1945 DOA: 10/29/2022 PCP: Andreas Blower., MD     Brief Narrative:  Sheryl Aguilar is a 77 y.o. female with medical history significant for severe morbid obesity with BMI of 56, COPD, OSA on CPAP, chronic hypoxia on 3 L nasal cannula continuously, chronic HFrEF, permanent atrial fibrillation on eliquis, recently discharged from outside facility 10/28/22 after 6 weeks hospital stay at [facility].  She was treated for pneumonia.  Her hospital stay was complicated by GI bleed, for which she received blood transfusion and had an EGD.  Was also complicated by HCAP and fluid overload she was treated for HCAP and she had a thoracentesis.  The patient was discharged home on 7/9, due to payment issues for SNF placement.  At home, she was unable to get from bed to commode and remained extremely weak.  She ended up back at [facility].   She was diagnosed with CAUTI.  Foley catheter was removed and patient was started on Rocephin.  PT evaluated patient and recommended for SNF placement.  New events last 24 hours / Subjective: Patient states that she was able to wear BiPAP for couple hours last night.  This morning, had a bowel movement and needed to be cleaned up.  Otherwise, remains medically stable.  Assessment & Plan:   Principal Problem:   UTI (urinary tract infection) Active Problems:   Chronic respiratory failure with hypoxia (HCC)   A-fib (HCC)   Generalized weakness   CKD stage 3b, GFR 30-44 ml/min (HCC)   Controlled type 2 diabetes mellitus without complication, without long-term current use of insulin (HCC)   Hyperlipidemia   Chronic systolic CHF (congestive heart failure) (HCC)   COPD (chronic obstructive pulmonary disease) (HCC)   OSA (obstructive sleep apnea)   Obesity hypoventilation syndrome (HCC)   Morbid obesity with BMI of 50.0-59.9, adult (HCC)   Klebsiella pneumonia CAUTI, present on  admission -Foley catheter was removed -Urine culture showing Klebsiella pneumonia, sensitive to cefazolin -Continue cefazolin x 7 days, can de-escalate to Keflex p.o. for discharge  Generalized weakness and debility -Following significant hospital stay greater than 30 days at an outside facility -Continue PT OT, recommending SNF placement.  TOC following for placement  CKD stage 3B -Baseline creatinine 1.4 -Stable today 1.47  Diabetes mellitus type 2, well-controlled -A1c 6.1 -Sliding scale insulin  A-fib -Eliquis, Coreg -Heart rate and blood pressure stable this morning on reduced dose of Coreg.  Continue  Hyperlipidemia -Lipitor  Chronic systolic CHF -Without exacerbation, continue heart healthy diet with fluid restriction -On Lasix  Chronic hypoxemic respiratory failure -Uses 3 L nasal cannula at baseline  COPD -Without exacerbation  OSA/OHS  -BiPAP nightly, encouraged compliance  Morbid obesity -Body mass index is 51.39 kg/m.  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 Follow up ultrasound is recommended   DVT prophylaxis:  apixaban (ELIQUIS) tablet 5 mg  Code Status: DNR Family Communication: Updated  daughter by phone 7/13.  No new updates today. Disposition Plan: SNF Status is: Inpatient Remains inpatient appropriate because: Medically improving.  SNF placement  is pending    Antimicrobials:  Anti-infectives (From admission, onward)    Start     Dose/Rate Route Frequency Ordered Stop   11/01/22 2000  ceFAZolin (ANCEF) IVPB 1 g/50 mL premix        1 g 100 mL/hr over 30 Minutes Intravenous Every 8 hours 11/01/22 1045 11/05/22 2359   10/30/22 2200  cefTRIAXone (ROCEPHIN) 2 g in sodium chloride 0.9 % 100 mL IVPB  Status:  Discontinued        2 g 200 mL/hr over 30 Minutes Intravenous Every 24 hours 10/30/22 0333 11/01/22 1029   10/30/22 0045  cefTRIAXone (ROCEPHIN) 2 g in sodium chloride 0.9 % 100 mL IVPB        2 g 200 mL/hr over 30 Minutes Intravenous  Once 10/30/22 0039 10/30/22 0230        Objective: Vitals:   11/02/22 0006 11/02/22 0410 11/02/22 0604 11/02/22 0738  BP: 113/65  (!) 113/45 113/76  Pulse: 88  87 89  Resp: 16  18 18   Temp: 98 F (36.7 C)  (!) 97.3 F (36.3 C) 97.6 F (36.4 C)  TempSrc: Oral     SpO2: 93%  95% 97%  Weight:  135.8 kg    Height:        Intake/Output Summary (Last 24 hours) at 11/02/2022 1102 Last data filed at 11/02/2022 0546 Gross per 24 hour  Intake 580 ml  Output 800 ml  Net -220 ml   Filed Weights   10/31/22 0424 11/01/22 0503 11/02/22 0410  Weight: 136.1 kg (!) 139.5 kg 135.8 kg    Examination:  General exam: Appears calm and comfortable  Respiratory system: Clear to auscultation. Respiratory effort normal. No respiratory distress. No conversational dyspnea.  Cardiovascular system: S1 & S2 heard. No murmurs. No pedal edema. Gastrointestinal system: Abdomen is nondistended, soft and nontender. Normal bowel sounds heard. Central nervous system: Alert and oriented. No focal neurological deficits. Speech clear.  Extremities: Symmetric in appearance  Psychiatry: Judgement and insight appear normal. Mood & affect appropriate.   Data Reviewed: I have personally reviewed following labs and imaging studies  CBC: Recent Labs  Lab 10/29/22 2304 10/30/22 0830 10/31/22 0658 11/02/22 0411   WBC 5.2 3.4* 5.4 4.7  NEUTROABS 3.6  --   --   --   HGB 8.7* 8.4* 8.4* 8.2*  HCT 29.0* 28.3* 28.3* 27.5*  MCV 100.3* 100.7* 104.0* 102.6*  PLT 180 167 168 162   Basic Metabolic Panel: Recent Labs  Lab 10/29/22 2304 10/30/22 0830 10/31/22 0658 11/01/22 0630 11/02/22 0411  NA 140 139 137 137 137  K 4.1 4.4 4.6 4.1 4.1  CL 96* 102 100 99 99  CO2 32 30 31 29 31   GLUCOSE 171* 226* 155* 112* 133*  BUN 34* 32* 40* 45* 40*  CREATININE 1.42* 1.48* 1.61* 1.53* 1.47*  CALCIUM 9.1 8.7* 8.5* 8.2* 8.3*  MG  --  1.7  --   --   --   PHOS  --  4.3  --   --   --    GFR: Estimated Creatinine Clearance: 44.1 mL/min (A) (by C-G formula based on SCr of 1.47 mg/dL (H)). Liver Function Tests: No results for input(s): "AST", "ALT", "ALKPHOS", "BILITOT", "PROT", "ALBUMIN" in the last 168 hours. No results for input(s): "LIPASE", "AMYLASE" in the last 168 hours. No results  for input(s): "AMMONIA" in the last 168 hours. Coagulation Profile: No results for input(s): "INR", "PROTIME" in the last 168 hours. Cardiac Enzymes: No results for input(s): "CKTOTAL", "CKMB", "CKMBINDEX", "TROPONINI" in the last 168 hours. BNP (last 3 results) No results for input(s): "PROBNP" in the last 8760 hours. HbA1C: No results for input(s): "HGBA1C" in the last 72 hours.  CBG: Recent Labs  Lab 11/01/22 0751 11/01/22 1233 11/01/22 1649 11/01/22 2227 11/02/22 0737  GLUCAP 111* 134* 130* 142* 121*   Lipid Profile: No results for input(s): "CHOL", "HDL", "LDLCALC", "TRIG", "CHOLHDL", "LDLDIRECT" in the last 72 hours. Thyroid Function Tests: No results for input(s): "TSH", "T4TOTAL", "FREET4", "T3FREE", "THYROIDAB" in the last 72 hours. Anemia Panel: No results for input(s): "VITAMINB12", "FOLATE", "FERRITIN", "TIBC", "IRON", "RETICCTPCT" in the last 72 hours. Sepsis Labs: No results for input(s): "PROCALCITON", "LATICACIDVEN" in the last 168 hours.  Recent Results (from the past 240 hour(s))  Urine Culture      Status: Abnormal   Collection Time: 10/29/22 11:16 PM   Specimen: Urine, Clean Catch  Result Value Ref Range Status   Specimen Description URINE, CLEAN CATCH  Final   Special Requests   Final    NONE Performed at [facility], [address], [place], [place] [address]    Culture >=100,000 COLONIES/mL KLEBSIELLA PNEUMONIAE (A)  Final   Report Status 11/01/2022 FINAL  Final   Organism ID, Bacteria KLEBSIELLA PNEUMONIAE (A)  Final      Susceptibility   Klebsiella pneumoniae - MIC*    AMPICILLIN >=32 RESISTANT Resistant     CEFAZOLIN <=4 SENSITIVE Sensitive     CEFEPIME <=0.12 SENSITIVE Sensitive     CEFTRIAXONE <=0.25 SENSITIVE Sensitive     CIPROFLOXACIN <=0.25 SENSITIVE Sensitive     GENTAMICIN <=1 SENSITIVE Sensitive     IMIPENEM <=0.25 SENSITIVE Sensitive     NITROFURANTOIN 128 RESISTANT Resistant     TRIMETH/SULFA <=20 SENSITIVE Sensitive     AMPICILLIN/SULBACTAM >=32 RESISTANT Resistant     PIP/TAZO 32 INTERMEDIATE Intermediate     * >=100,000 COLONIES/mL KLEBSIELLA PNEUMONIAE      Radiology Studies: No results found.    Scheduled Meds:  apixaban  5 mg Oral BID   atorvastatin  10 mg Oral Daily   carvedilol  12.5 mg Oral BID WC   DULoxetine  60 mg Oral Daily   furosemide  20 mg Oral BID   insulin aspart  0-20 Units Subcutaneous TID WC   insulin aspart  0-5 Units Subcutaneous QHS   mirtazapine  7.5 mg Oral QHS   Continuous Infusions:   ceFAZolin (ANCEF) IV Stopped (11/02/22 0438)     LOS: 3 days   Time spent: 20 minutes Noralee Stain, DO [facility] 11/02/2022, 11:02 AM   Available via Epic secure chat 7am-7pm After these hours, please refer to coverage provider listed on [email or web address]

## 2022-11-03 DIAGNOSIS — N3000 Acute cystitis without hematuria: Secondary | ICD-10-CM | POA: Diagnosis not present

## 2022-11-03 LAB — CBC
HCT: 28.3 % — ABNORMAL LOW (ref 36.0–46.0)
Hemoglobin: 8.4 g/dL — ABNORMAL LOW (ref 12.0–15.0)
MCH: 30.4 pg (ref 26.0–34.0)
MCHC: 29.7 g/dL — ABNORMAL LOW (ref 30.0–36.0)
MCV: 102.5 fL — ABNORMAL HIGH (ref 80.0–100.0)
Platelets: 179 10*3/uL (ref 150–400)
RBC: 2.76 MIL/uL — ABNORMAL LOW (ref 3.87–5.11)
RDW: 19.8 % — ABNORMAL HIGH (ref 11.5–15.5)
WBC: 3.4 10*3/uL — ABNORMAL LOW (ref 4.0–10.5)
nRBC: 0.9 % — ABNORMAL HIGH (ref 0.0–0.2)

## 2022-11-03 LAB — BASIC METABOLIC PANEL
Anion gap: 7 (ref 5–15)
BUN: 33 mg/dL — ABNORMAL HIGH (ref 8–23)
CO2: 31 mmol/L (ref 22–32)
Calcium: 8.4 mg/dL — ABNORMAL LOW (ref 8.9–10.3)
Chloride: 100 mmol/L (ref 98–111)
Creatinine, Ser: 1.21 mg/dL — ABNORMAL HIGH (ref 0.44–1.00)
GFR, Estimated: 46 mL/min — ABNORMAL LOW (ref >60)
Glucose, Bld: 128 mg/dL — ABNORMAL HIGH (ref 70–99)
Potassium: 4.4 mmol/L (ref 3.5–5.1)
Sodium: 138 mmol/L (ref 135–145)

## 2022-11-03 LAB — GLUCOSE, CAPILLARY
Glucose-Capillary: 124 mg/dL — ABNORMAL HIGH (ref 70–99)
Glucose-Capillary: 146 mg/dL — ABNORMAL HIGH (ref 70–99)
Glucose-Capillary: 155 mg/dL — ABNORMAL HIGH (ref 70–99)
Glucose-Capillary: 108 mg/dL — ABNORMAL HIGH (ref 70–99)

## 2022-11-03 NOTE — TOC Progression Note (Addendum)
Transition of Care [facility]) - Progression Note    Patient Details  Name: Sheryl Aguilar MRN: [id number] Date of Birth: 1946-03-31  Transition of Care [facility]) CM/SW Contact  Janae Bridgeman, RN Phone Number: 11/03/2022, 10:50 AM  Clinical Narrative:    CM called and spoke with the patient's daughter by phone and discussed with her that patient has 4 Medicare days left for placement at SNF and then patient would be in her co-pay days for placement.  The patient's daughter is weighing out safe discharge options at this time since she works full-time during the day.  The patient's daughter was agreeable to SNF placement and asked that Hannah Beat be called to inquire about admission.  I also mentioned to the daughter about Personal Care through [facility] with home health and other alternative for PACE program.  PACE program information was emailed to the daughter - [email or web address].  Kiva, MSW was notified and she will explore options for SNF placement at this time and will follow up with the daughter by phone.  11/03/2022 1100 - I called and spoke with admissions at [facility] and the facility is able to extend a bed offer to the patient for admission.  The daughter was updated and is aware that patient only has 4 days left of Medicare days and that daily charge would be incurred after those days if patient remains at the facility.   I spoke with the daughter at length and discussed that [facility] health was in agreement to assume care of the patient after the SNF - Bjorn Loser was called and she was in agreement to set up home health for RN, PT, OT, aide and MSW.  Hannah Beat is aware and will be updated to place orders and call [facility] once patient is ready to leave the facilikty and return home.  I filled out Personal Care document through [facility] and this will be sent once the attending physician co-signs tomorrow.  The patient's daughter is updated regarding the above.  Kiva  [place], MSW will work on [employer].  The daughter is aware that facility only has a semi-private room and she is in agreement for the admission to the facility.  The patient, once authorization is approved in the next 1-2 days will need to discharge to the facility by PTAR.   Expected Discharge Plan: Skilled Nursing Facility Barriers to Discharge: Continued Medical Work up  Expected Discharge Plan and Services In-house Referral: Clinical Social Work Discharge Planning Services: CM Consult Post Acute Care Choice: Skilled Nursing Facility Living arrangements for the past 2 months: Apartment                   DME Agency: [employer] Date DME Agency Contacted: 10/31/22 Time DME Agency Contacted: 1650 Representative spoke with at DME Agency: Vaughan Basta, CM with [employer]             Social Determinants of Health (SDOH) Interventions SDOH Screenings   Food Insecurity: No Food Insecurity (11/01/2022)  Housing: Patient Declined (11/01/2022)  Transportation Needs: No Transportation Needs (10/30/2022)  Utilities: Not At Risk (10/30/2022)  Financial Resource Strain: Medium Risk (03/17/2022)   [facility]  Physical Activity: Inactive (03/17/2022)   [facility]  Social Connections: Socially Isolated (03/17/2022)   [facility]  Stress: No Stress Concern Present (03/17/2022)   [facility]  Tobacco Use: Low Risk  (10/29/2022)  Recent Concern: Tobacco Use - Medium Risk (10/24/2022)   [facility]    Readmission Risk Interventions    10/31/2022    4:52 PM  Readmission Risk Prevention Plan   Transportation Screening Complete  PCP or Specialist Appt within 5-7 Days Complete  Home Care Screening Complete  Medication Review (RN CM) Complete

## 2022-11-03 NOTE — Discharge Instructions (Signed)

## 2022-11-03 NOTE — Progress Notes (Signed)
PROGRESS NOTE    Sheryl Aguilar  ZOX:[id number] DOB: 11/05/1945 DOA: 10/29/2022 PCP: Andreas Blower., MD     Brief Narrative:  Sheryl Aguilar is a 77 y.o. female with medical history significant for severe morbid obesity with BMI of 56, COPD, OSA on CPAP, chronic hypoxia on 3 L nasal cannula continuously, chronic HFrEF, permanent atrial fibrillation on eliquis, recently discharged from outside facility 10/28/22 after 6 weeks [facility] stay [facility].  She was treated for pneumonia.  Her hospital stay was complicated by GI bleed, for which she received blood transfusion and had an EGD.  Was also complicated by HCAP and fluid overload she was treated for HCAP and she had a thoracentesis.  The patient was discharged home on 7/9, due to payment issues for SNF placement.  At home, she was unable to get from bed to commode and remained extremely weak.  She ended up back at [facility].   She was diagnosed with CAUTI.  Foley catheter was removed and patient was started on Rocephin.  PT evaluated patient and recommended for SNF placement.  New events last 24 hours / Subjective: Wore her BiPAP mask for an hour last night, could not tolerate anymore.  Otherwise feeling well overall.  No new complaints.  Assessment & Plan:   Principal Problem:   UTI (urinary tract infection) Active Problems:   Chronic respiratory failure with hypoxia (HCC)   A-fib (HCC)   Generalized weakness   CKD stage 3b, GFR 30-44 ml/min (HCC)   Controlled type 2 diabetes mellitus without complication, without long-term current use of insulin (HCC)   Hyperlipidemia   Chronic systolic CHF (congestive heart failure) (HCC)   COPD (chronic obstructive pulmonary disease) (HCC)   OSA (obstructive sleep apnea)   Obesity hypoventilation syndrome (HCC)   Morbid obesity with BMI of 50.0-59.9, adult (HCC)   Klebsiella pneumonia CAUTI, present on admission -Foley catheter was removed -Urine culture  showing Klebsiella pneumonia, sensitive to cefazolin -Continue cefazolin x 7 days, can de-escalate to Keflex p.o. for discharge  Generalized weakness and debility -Following significant hospital stay greater than 30 days at an outside facility -Continue PT OT, recommending SNF placement.  TOC following for placement  CKD stage 3B -Baseline creatinine 1.4 -Stable today 1.21  Diabetes mellitus type 2, well-controlled -A1c 6.1 -Sliding scale insulin  A-fib -Eliquis, Coreg -Heart rate and blood pressure stable this morning on reduced dose of Coreg.  Continue  Hyperlipidemia -Lipitor  Chronic systolic CHF -Without exacerbation, continue heart healthy diet with fluid restriction -On Lasix  Chronic hypoxemic respiratory failure -Uses 3 L nasal cannula at baseline  COPD -Without exacerbation  OSA/OHS  -BiPAP nightly, encouraged compliance  Morbid obesity -Body mass index is 51.28 kg/m.  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 Follow up ultrasound is recommended   DVT prophylaxis:  apixaban (ELIQUIS) tablet 5 mg  Code Status: DNR Family Communication: Updated daughter by phone 7/15 Disposition Plan: SNF Status is: Inpatient  Remains inpatient appropriate because: Medically stable.  SNF placement is pending    Antimicrobials:  Anti-infectives (From admission, onward)    Start      Dose/Rate Route Frequency Ordered Stop   11/01/22 2000  ceFAZolin (ANCEF) IVPB 1 g/50 mL premix        1 g 100 mL/hr over 30 Minutes Intravenous Every 8 hours 11/01/22 1045 11/05/22 2359   10/30/22 2200  cefTRIAXone (ROCEPHIN) 2 g in sodium chloride 0.9 % 100 mL IVPB  Status:  Discontinued        2 g 200 mL/hr over 30 Minutes Intravenous Every 24 hours 10/30/22 0333 11/01/22 1029   10/30/22 0045  cefTRIAXone (ROCEPHIN) 2 g in sodium chloride 0.9 % 100 mL IVPB        2 g 200 mL/hr over 30 Minutes Intravenous  Once 10/30/22 0039 10/30/22 0230        Objective: Vitals:   11/03/22 0335 11/03/22 0500 11/03/22 0627 11/03/22 0736  BP: 138/73  (!) 115/54 103/63  Pulse: 86  85 85  Resp: 18  18 18   Temp:   98.4 F (36.9 C) 98.3 F (36.8 C)  TempSrc:      SpO2:   96% 96%  Weight:  135.5 kg    Height:        Intake/Output Summary (Last 24 hours) at 11/03/2022 1154 Last data filed at 11/03/2022 0953 Gross per 24 hour  Intake 120 ml  Output 300 ml  Net -180 ml   Filed Weights   11/01/22 0503 11/02/22 0410 11/03/22 0500  Weight: (!) 139.5 kg 135.8 kg 135.5 kg    Examination:  General exam: Appears calm and comfortable  Respiratory system: Clear to auscultation. Respiratory effort normal. No respiratory distress. No conversational dyspnea.  Cardiovascular system: S1 & S2 heard. No murmurs. No pedal edema. Gastrointestinal system: Abdomen is nondistended, soft and nontender. Normal bowel sounds heard. Central nervous system: Alert and oriented. No focal neurological deficits. Speech clear.  Extremities: Symmetric in appearance  Psychiatry: Judgement and insight appear normal. Mood & affect appropriate.   Data Reviewed: I have personally reviewed following labs and imaging studies  CBC: Recent Labs  Lab 10/29/22 2304 10/30/22 0830 10/31/22 0658 11/02/22 0411 11/03/22 0752  WBC 5.2 3.4* 5.4 4.7 3.4*  NEUTROABS 3.6  --   --   --   --   HGB 8.7* 8.4* 8.4* 8.2* 8.4*  HCT  29.0* 28.3* 28.3* 27.5* 28.3*  MCV 100.3* 100.7* 104.0* 102.6* 102.5*  PLT 180 167 168 162 179   Basic Metabolic Panel: Recent Labs  Lab 10/30/22 0830 10/31/22 0658 11/01/22 0630 11/02/22 0411 11/03/22 0752  NA 139 137 137 137 138  K 4.4 4.6 4.1 4.1 4.4  CL 102 100 99 99 100  CO2 30 31 29 31 31   GLUCOSE 226* 155* 112* 133* 128*  BUN 32* 40* 45* 40* 33*  CREATININE 1.48* 1.61* 1.53* 1.47* 1.21*  CALCIUM 8.7* 8.5* 8.2* 8.3* 8.4*  MG 1.7  --   --   --   --   PHOS 4.3  --   --   --   --    GFR: Estimated Creatinine Clearance: 53.5 mL/min (A) (by C-G formula based on SCr of 1.21 mg/dL (H)). Liver Function Tests: No results for input(s): "AST", "ALT", "ALKPHOS", "BILITOT", "PROT", "ALBUMIN" in the last 168 hours. No results for input(s): "LIPASE", "AMYLASE" in the last 168 hours. No results for input(s): "AMMONIA" in the last 168 hours. Coagulation  Profile: No results for input(s): "INR", "PROTIME" in the last 168 hours. Cardiac Enzymes: No results for input(s): "CKTOTAL", "CKMB", "CKMBINDEX", "TROPONINI" in the last 168 hours. BNP (last 3 results) No results for input(s): "PROBNP" in the last 8760 hours. HbA1C: No results for input(s): "HGBA1C" in the last 72 hours.  CBG: Recent Labs  Lab 11/02/22 0737 11/02/22 1148 11/02/22 1654 11/02/22 2110 11/03/22 0735  GLUCAP 121* 147* 125* 145* 124*   Lipid Profile: No results for input(s): "CHOL", "HDL", "LDLCALC", "TRIG", "CHOLHDL", "LDLDIRECT" in the last 72 hours. Thyroid Function Tests: No results for input(s): "TSH", "T4TOTAL", "FREET4", "T3FREE", "THYROIDAB" in the last 72 hours. Anemia Panel: No results for input(s): "VITAMINB12", "FOLATE", "FERRITIN", "TIBC", "IRON", "RETICCTPCT" in the last 72 hours. Sepsis Labs: No results for input(s): "PROCALCITON", "LATICACIDVEN" in the last 168 hours.  Recent Results (from the past 240 hour(s))  Urine Culture     Status: Abnormal   Collection Time: 10/29/22 11:16 PM    Specimen: Urine, Clean Catch  Result Value Ref Range Status   Specimen Description URINE, CLEAN CATCH  Final   Special Requests   Final    NONE Performed at [facility], [address] [place], [place] [address]    Culture >=100,000 COLONIES/mL KLEBSIELLA PNEUMONIAE (A)  Final   Report Status 11/01/2022 FINAL  Final   Organism ID, Bacteria KLEBSIELLA PNEUMONIAE (A)  Final      Susceptibility   Klebsiella pneumoniae - MIC*    AMPICILLIN >=32 RESISTANT Resistant     CEFAZOLIN <=4 SENSITIVE Sensitive     CEFEPIME <=0.12 SENSITIVE Sensitive     CEFTRIAXONE <=0.25 SENSITIVE Sensitive     CIPROFLOXACIN <=0.25 SENSITIVE Sensitive     GENTAMICIN <=1 SENSITIVE Sensitive     IMIPENEM <=0.25 SENSITIVE Sensitive     NITROFURANTOIN 128 RESISTANT Resistant     TRIMETH/SULFA <=20 SENSITIVE Sensitive     AMPICILLIN/SULBACTAM >=32 RESISTANT Resistant     PIP/TAZO 32 INTERMEDIATE Intermediate     * >=100,000 COLONIES/mL KLEBSIELLA PNEUMONIAE      Radiology Studies: No results found.    Scheduled Meds:  apixaban  5 mg Oral BID   atorvastatin  10 mg Oral Daily   carvedilol  12.5 mg Oral BID WC   DULoxetine  60 mg Oral Daily   furosemide  20 mg Oral BID   insulin aspart  0-20 Units Subcutaneous TID WC   insulin aspart  0-5 Units Subcutaneous QHS   mirtazapine  7.5 mg Oral QHS   Continuous Infusions:   ceFAZolin (ANCEF) IV 1 g (11/03/22 0616)     LOS: 4 days   Time spent: 25 minutes Noralee Stain, DO [facility] 11/03/2022, 11:54 AM   Available via Epic secure chat 7am-7pm After these hours, please refer to coverage provider listed on [email or web address]

## 2022-11-03 NOTE — Progress Notes (Signed)
Physical Therapy Treatment Patient Details Name: Sheryl Aguilar MRN: [id number] DOB: 04/15/1946 Today's Date: 11/03/2022   History of Present Illness 77 y.o. female admitted 7/10 with SOB, weakness. PMHx: pt D/Cd from [facility] 10/28/22 after 6 week stay for PNA and GIB. Severe morbid obesity, COPD, OSA on CPAP, chronic hypoxia on 3 L, HFrEF, Afib, chronic pain, breast CA    PT Comments  Patient agreed to supine and sitting exercises for LE strengthening. Stedy lift brought to room for standing attempts and pt did not feel safe attempting with only one person assist. Attempted to assure pt it was safe to try to stand with one person, however she did not feel comfortable with this. Good participation and tolerance with bil LE exercises. Bed mobility to come to sit and return to supine improved.      Assistance Recommended at Discharge Frequent or constant Supervision/Assistance  If plan is discharge home, recommend the following:  Can travel by private vehicle    A lot of help with walking and/or transfers;A lot of help with bathing/dressing/bathroom;Assistance with cooking/housework;Direct supervision/assist for medications management;Assist for transportation;Help with stairs or ramp for entrance   No  Equipment Recommendations  None recommended by PT    Recommendations for Other Services       Precautions / Restrictions Precautions Precautions: Fall Restrictions Weight Bearing Restrictions: No     Mobility  Bed Mobility Overal bed mobility: Needs Assistance Bed Mobility: Supine to Sit Rolling: Supervision   Supine to sit: Supervision     General bed mobility comments: HOB flat with +use of rail; incr effort and time    Transfers                   General transfer comment: Brought stedy to room, however pt refused to attempt standing with only 1 person assist present. Attempted to reassure her that it was safe to attempt and would be good for strengthening and she  continued to insist it was not safe for a lady her size.    Ambulation/Gait                   Stairs             Wheelchair Mobility     Tilt Bed    Modified Rankin (Stroke Patients Only)       Balance Overall balance assessment: Needs assistance Sitting-balance support: No upper extremity supported, Feet supported, Bilateral upper extremity supported Sitting balance-Leahy Scale: Fair                                      Cognition Arousal/Alertness: Awake/alert Behavior During Therapy: WFL for tasks assessed/performed                               Awareness: Emergent, Anticipatory (however, sometimes over anticipates difficulty)   General Comments: following commands for ROM exercised without difficulty or delay        Exercises General Exercises - Lower Extremity Ankle Circles/Pumps: AROM, Both, 10 reps Quad Sets: AROM, Both, 10 reps Short Arc Quad: AROM, Both, 10 reps Long Arc Quad: AROM, Both, Seated, 10 reps Heel Slides: AROM, Both, 10 reps Hip ABduction/ADduction: AROM, Both, 10 reps, Supine, Seated (1 set supine; 1 set seated) Hip Flexion/Marching: AROM, Both, Seated, 10 reps Toe Raises: AROM, Both, 10 reps, Seated Heel  Raises: AROM, Both, 10 reps, Seated    General Comments        Pertinent Vitals/Pain Pain Assessment Pain Assessment: Faces Faces Pain Scale: Hurts even more Pain Location: rt hip with AROM Pain Descriptors / Indicators: Sore Pain Intervention(s): Limited activity within patient's tolerance, Monitored during session, Repositioned    Home Living                          Prior Function            PT Goals (current goals can now be found in the care plan section) Acute Rehab PT Goals Patient Stated Goal: go to rehab then return home Time For Goal Achievement: 11/13/22 Potential to Achieve Goals: Good Progress towards PT goals: Progressing toward goals (less assist for bed  mobility)    Frequency    Min 2X/week      PT Plan Current plan remains appropriate    Co-evaluation              AM-PAC PT "6 Clicks" Mobility   Outcome Measure  Help needed turning from your back to your side while in a flat bed without using bedrails?: A Little Help needed moving from lying on your back to sitting on the side of a flat bed without using bedrails?: A Little Help needed moving to and from a bed to a chair (including a wheelchair)?: Total Help needed standing up from a chair using your arms (e.g., wheelchair or bedside chair)?: Total Help needed to walk in hospital room?: Total Help needed climbing 3-5 steps with a railing? : Total 6 Click Score: 10    End of Session Equipment Utilized During Treatment: Oxygen Activity Tolerance: Patient tolerated treatment well Patient left: with call bell/phone within reach;in bed   PT Visit Diagnosis: Other abnormalities of gait and mobility (R26.89);Muscle weakness (generalized) (M62.81)     Time: 3086-5784 PT Time Calculation (min) (ACUTE ONLY): 27 min  Charges:    $Therapeutic Exercise: 23-37 mins PT General Charges $$ ACUTE PT VISIT: 1 Visit                      Jerolyn Center, PT Acute Rehabilitation Services  Office [phone]    Zena Amos 11/03/2022, 2:46 PM

## 2022-11-03 NOTE — Plan of Care (Signed)

## 2022-11-03 NOTE — Progress Notes (Signed)
Pharmacist Heart Failure Core Measure Documentation  Assessment: Sheryl Aguilar has an EF documented as 30-35% on 09/22/22 by ECHO.  Rationale: Heart failure patients with left ventricular systolic dysfunction (LVSD) and an EF < 40% should be prescribed an angiotensin converting enzyme inhibitor (ACEI) or angiotensin receptor blocker (ARB) at discharge unless a contraindication is documented in the medical record.  This patient is not currently on an ACEI or ARB for HF.  This note is being placed in the record in order to provide documentation that a contraindication to the use of these agents is present for this encounter.  ACE Inhibitor or Angiotensin Receptor Blocker is contraindicated (specify all that apply)  []   ACEI allergy AND ARB allergy []   Angioedema []   Moderate or severe aortic stenosis []   Hyperkalemia []   Hypotension []   Renal artery stenosis [x]   Worsening renal function, preexisting renal disease or dysfunction

## 2022-11-03 NOTE — Progress Notes (Signed)
   11/03/22 2154  BiPAP/CPAP/SIPAP  BiPAP/CPAP/SIPAP Pt Type Adult  BiPAP/CPAP/SIPAP DREAMSTATIOND  Reason BIPAP/CPAP not in use Non-compliant (Pt states she does not want to wear tonight.)

## 2022-11-04 DIAGNOSIS — N3000 Acute cystitis without hematuria: Secondary | ICD-10-CM | POA: Diagnosis not present

## 2022-11-04 LAB — GLUCOSE, CAPILLARY
Glucose-Capillary: 122 mg/dL — ABNORMAL HIGH (ref 70–99)
Glucose-Capillary: 121 mg/dL — ABNORMAL HIGH (ref 70–99)
Glucose-Capillary: 115 mg/dL — ABNORMAL HIGH (ref 70–99)
Glucose-Capillary: 140 mg/dL — ABNORMAL HIGH (ref 70–99)

## 2022-11-04 NOTE — Progress Notes (Signed)
Occupational Therapy Treatment Patient Details Name: Sheryl Aguilar MRN: [id number] DOB: [birth year] Today's Date: 11/04/2022   History of present illness 77 y.o. female admitted 7/10 with SOB, weakness. PMHx: pt D/Cd from [facility] 10/28/22 after 6 week stay for PNA and GIB. Severe morbid obesity, COPD, OSA on CPAP, chronic hypoxia on 3 L, HFrEF, Afib, chronic pain, breast CA   OT comments  Patient participates well with OT treatment for bed mobility and balance sitting on EOB. Patient declines getting OOB or standing at bedside with [facility] without 2 people and states is difficult to stand from recliner with Stedy.Patient performed grooming and UE HEP with red therapy band while seated on EOB and remained on EOB at end of session. Patient will benefit from continued inpatient follow up therapy, <3 hours/day to address self care and functional transfers. Acute OT to continue to follow.    Recommendations for follow up therapy are one component of a multi-disciplinary discharge planning process, led by the attending physician.  Recommendations may be updated based on patient status, additional functional criteria and insurance authorization.    Assistance Recommended at Discharge Frequent or constant Supervision/Assistance  Patient can return home with the following  Two people to help with walking and/or transfers;Two people to help with bathing/dressing/bathroom;A lot of help with bathing/dressing/bathroom;Assistance with cooking/housework;Assist for transportation;Help with stairs or ramp for entrance   Equipment Recommendations  Other (comment) (defer)    Recommendations for Other Services      Precautions / Restrictions Precautions Precautions: Fall Restrictions Weight Bearing Restrictions: No       Mobility Bed Mobility Overal bed mobility: Needs Assistance Bed Mobility: Supine to Sit     Supine to sit: Supervision     General bed mobility comments: no physical assistance  needed to get to EOB    Transfers Overall transfer level: Needs assistance                 General transfer comment: declines OOB without 2 person assist with Stedy     Balance Overall balance assessment: Needs assistance Sitting-balance support: No upper extremity supported, Feet supported, Bilateral upper extremity supported Sitting balance-Leahy Scale: Fair Sitting balance - Comments: able to perform self feeding, grooming and UE HEP seated on EOB                                   ADL either performed or assessed with clinical judgement   ADL Overall ADL's : Needs assistance/impaired Eating/Feeding: Modified independent;Sitting Eating/Feeding Details (indicate cue type and reason): on EOB Grooming: Wash/dry hands;Wash/dry face;Oral care;Set up;Sitting Grooming Details (indicate cue type and reason): EOB                               General ADL Comments: declined OOB with one person assist    Extremity/Trunk Assessment              Vision       Perception     Praxis      Cognition Arousal/Alertness: Awake/alert Behavior During Therapy: WFL for tasks assessed/performed Overall Cognitive Status: Impaired/Different from baseline Area of Impairment: Safety/judgement, Awareness, Problem solving                           Awareness: Anticipatory Problem Solving: Slow processing, Requires verbal cues  Exercises Exercises: General Upper Extremity General Exercises - Upper Extremity Shoulder Flexion: Strengthening, Both, 10 reps, Seated, Theraband Theraband Level (Shoulder Flexion): Level 2 (Red) Shoulder Horizontal ABduction: Strengthening, Both, 15 reps, Seated, Theraband Theraband Level (Shoulder Horizontal Abduction): Level 2 (Red) Elbow Flexion: Strengthening, Both, 15 reps, Seated, Theraband Theraband Level (Elbow Flexion): Level 2 (Red) Elbow Extension: Strengthening, Both, 15 reps, Seated,  Theraband Theraband Level (Elbow Extension): Level 2 (Red)    Shoulder Instructions       General Comments      Pertinent Vitals/ Pain       Pain Assessment Pain Assessment: Faces Faces Pain Scale: Hurts a little bit Pain Location: right hip getting to EOB Pain Descriptors / Indicators: Sore, Grimacing Pain Intervention(s): Monitored during session  Home Living                                          Prior Functioning/Environment              Frequency  Min 2X/week        Progress Toward Goals  OT Goals(current goals can now be found in the care plan section)  Progress towards OT goals: Progressing toward goals  Acute Rehab OT Goals Patient Stated Goal: go home when strong enough OT Goal Formulation: With patient Time For Goal Achievement: 11/13/22 Potential to Achieve Goals: Fair ADL Goals Pt Will Perform Grooming: with modified independence;sitting Pt Will Perform Lower Body Dressing: sit to/from stand;with mod assist Pt Will Transfer to Toilet: with min assist;with transfer board Pt/caregiver will Perform Home Exercise Program: Both right and left upper extremity;Increased strength;With written HEP provided Additional ADL Goal #1: Pt will static stand 1+ min as precursor to engagemet in standing ADL  Plan Discharge plan remains appropriate    Co-evaluation                 AM-PAC OT "6 Clicks" Daily Activity     Outcome Measure   Help from another person eating meals?: None Help from another person taking care of personal grooming?: A Little Help from another person toileting, which includes using toliet, bedpan, or urinal?: Total Help from another person bathing (including washing, rinsing, drying)?: A Lot Help from another person to put on and taking off regular upper body clothing?: A Little Help from another person to put on and taking off regular lower body clothing?: Total 6 Click Score: 14    End of Session  Equipment Utilized During Treatment: Oxygen  OT Visit Diagnosis: Unsteadiness on feet (R26.81);Muscle weakness (generalized) (M62.81);Other abnormalities of gait and mobility (R26.89)   Activity Tolerance Patient tolerated treatment well   Patient Left with call bell/phone within reach (seated on EOB)   Nurse Communication Mobility status        Time: 6578-4696 OT Time Calculation (min): 27 min  Charges: OT General Charges $OT Visit: 1 Visit OT Treatments $Self Care/Home Management : 8-22 mins $Therapeutic Exercise: 8-22 mins  Sheryl Aguilar, OTA Acute Rehabilitation Services  Office [phone]   Sheryl Aguilar 11/04/2022, 12:48 PM

## 2022-11-04 NOTE — Care Management Important Message (Signed)
Important Message  Patient Details  Name: Sheryl Aguilar MRN: [id number] Date of Birth: 1945/10/07   Medicare Important Message Given:  Yes     [name] 11/04/2022, 2:12 PM

## 2022-11-04 NOTE — Plan of Care (Signed)

## 2022-11-04 NOTE — Progress Notes (Signed)
PROGRESS NOTE    Sheryl Aguilar  XBJ:[id number] DOB: 08-25-1945 DOA: 10/29/2022 PCP: Andreas Blower., MD     Brief Narrative:  Sheryl Aguilar is a 77 y.o. female with medical history significant for severe morbid obesity with BMI of 56, COPD, OSA on CPAP, chronic hypoxia on 3 L nasal cannula continuously, chronic HFrEF, permanent atrial fibrillation on eliquis, recently discharged from outside facility 10/28/22 after 6 weeks hospital stay at [facility].  She was treated for pneumonia.  Her hospital stay was complicated by GI bleed, for which she received blood transfusion and had an EGD.  Was also complicated by HCAP and fluid overload she was treated for HCAP and she had a thoracentesis.  The patient was discharged home on 7/9, due to payment issues for SNF placement.  At home, she was unable to get from bed to commode and remained extremely weak.  She ended up back at [facility].   She was diagnosed with CAUTI.  Foley catheter was removed and patient was started on Rocephin.  PT evaluated patient and recommended for SNF placement.  New events last 24 hours / Subjective: States she worked with OT this morning.  Feeling well.  Motivated to continue strength training to increase her mobility and strength.  Awaiting SNF placement  Assessment & Plan:   Principal Problem:   UTI (urinary tract infection) Active Problems:   Chronic respiratory failure with hypoxia (HCC)   A-fib (HCC)   Generalized weakness   CKD stage 3b, GFR 30-44 ml/min (HCC)   Controlled type 2 diabetes mellitus without complication, without long-term current use of insulin (HCC)   Hyperlipidemia   Chronic systolic CHF (congestive heart failure) (HCC)   COPD (chronic obstructive pulmonary disease) (HCC)   OSA (obstructive sleep apnea)   Obesity hypoventilation syndrome (HCC)   Morbid obesity with BMI of 50.0-59.9, adult (HCC)   Klebsiella pneumonia CAUTI, present on admission -Foley catheter  was removed -Urine culture showing Klebsiella pneumonia, sensitive to cefazolin -Continue cefazolin x 7 days, can de-escalate to Keflex p.o. for discharge  Generalized weakness and debility -Following significant hospital stay greater than 30 days at an outside facility -Continue PT OT, recommending SNF placement.  TOC following for placement  CKD stage 3B -Baseline creatinine 1.4 -Stable   Diabetes mellitus type 2, well-controlled -A1c 6.1 -Sliding scale insulin  A-fib -Eliquis, Coreg  Hyperlipidemia -Lipitor  Chronic systolic CHF -Without exacerbation, continue heart healthy diet with fluid restriction -On Lasix  Chronic hypoxemic respiratory failure -Uses 3 L nasal cannula at baseline  COPD -Without exacerbation  OSA/OHS  -BiPAP nightly, encouraged compliance  Morbid obesity -Body mass index is 39.01 kg/m.  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 Follow up ultrasound is recommended   DVT prophylaxis:  apixaban (ELIQUIS) tablet 5 mg  Code Status: DNR Family Communication: Updated daughter by phone 7/15 Disposition Plan: SNF Status is: Inpatient Remains inpatient appropriate because: Medically stable.  SNF placement is pending  Antimicrobials:  Anti-infectives (From admission, onward)    Start     Dose/Rate Route Frequency Ordered Stop   11/01/22 2000  ceFAZolin  (ANCEF) IVPB 1 g/50 mL premix        1 g 100 mL/hr over 30 Minutes Intravenous Every 8 hours 11/01/22 1045 11/05/22 2359   10/30/22 2200  cefTRIAXone (ROCEPHIN) 2 g in sodium chloride 0.9 % 100 mL IVPB  Status:  Discontinued        2 g 200 mL/hr over 30 Minutes Intravenous Every 24 hours 10/30/22 0333 11/01/22 1029   10/30/22 0045  cefTRIAXone (ROCEPHIN) 2 g in sodium chloride 0.9 % 100 mL IVPB        2 g 200 mL/hr over 30 Minutes Intravenous  Once 10/30/22 0039 10/30/22 0230        Objective: Vitals:   11/04/22 0033 11/04/22 0500 11/04/22 0518 11/04/22 0755  BP: 128/66  127/80 (!) 111/57  Pulse: 81  81 73  Resp: 18     Temp: 98.2 F (36.8 C)  (!) 97.5 F (36.4 C) 98.1 F (36.7 C)  TempSrc: Oral  Oral   SpO2: 95%  97% 96%  Weight:  103.1 kg    Height:        Intake/Output Summary (Last 24 hours) at 11/04/2022 1129 Last data filed at 11/03/2022 2200 Gross per 24 hour  Intake 120 ml  Output 650 ml  Net -530 ml   Filed Weights   11/02/22 0410 11/03/22 0500 11/04/22 0500  Weight: 135.8 kg 135.5 kg 103.1 kg    Examination:  General exam: Appears calm and comfortable  Respiratory system: Clear to auscultation. Respiratory effort normal. No respiratory distress. No conversational dyspnea.  Cardiovascular system: S1 & S2 heard. No murmurs. No pedal edema. Gastrointestinal system: Abdomen is nondistended, soft and nontender. Normal bowel sounds heard. Central nervous system: Alert and oriented. No focal neurological deficits. Speech clear.  Extremities: Symmetric in appearance  Psychiatry: Judgement and insight appear normal. Mood & affect appropriate.   Data Reviewed: I have personally reviewed following labs and imaging studies  CBC: Recent Labs  Lab 10/29/22 2304 10/30/22 0830 10/31/22 0658 11/02/22 0411 11/03/22 0752  WBC 5.2 3.4* 5.4 4.7 3.4*  NEUTROABS 3.6  --   --   --   --   HGB 8.7* 8.4* 8.4* 8.2* 8.4*  HCT 29.0* 28.3* 28.3* 27.5* 28.3*  MCV 100.3*  100.7* 104.0* 102.6* 102.5*  PLT 180 167 168 162 179   Basic Metabolic Panel: Recent Labs  Lab 10/30/22 0830 10/31/22 0658 11/01/22 0630 11/02/22 0411 11/03/22 0752  NA 139 137 137 137 138  K 4.4 4.6 4.1 4.1 4.4  CL 102 100 99 99 100  CO2 30 31 29 31 31   GLUCOSE 226* 155* 112* 133* 128*  BUN 32* 40* 45* 40* 33*  CREATININE 1.48* 1.61* 1.53* 1.47* 1.21*  CALCIUM 8.7* 8.5* 8.2* 8.3* 8.4*  MG 1.7  --   --   --   --   PHOS 4.3  --   --   --   --    GFR: Estimated Creatinine Clearance: 45.5 mL/min (A) (by C-G formula based on SCr of 1.21 mg/dL (H)). Liver Function Tests: No results for input(s): "AST", "ALT", "ALKPHOS", "BILITOT", "PROT", "ALBUMIN" in the last 168 hours. No results for input(s): "LIPASE", "AMYLASE" in the last 168 hours. No results for input(s): "AMMONIA" in the last 168 hours. Coagulation Profile: No results for input(s): "INR", "PROTIME" in the last 168  hours. Cardiac Enzymes: No results for input(s): "CKTOTAL", "CKMB", "CKMBINDEX", "TROPONINI" in the last 168 hours. BNP (last 3 results) No results for input(s): "PROBNP" in the last 8760 hours. HbA1C: No results for input(s): "HGBA1C" in the last 72 hours.  CBG: Recent Labs  Lab 11/03/22 0735 11/03/22 1229 11/03/22 1555 11/03/22 2116 11/04/22 0822  GLUCAP 124* 146* 155* 108* 122*   Lipid Profile: No results for input(s): "CHOL", "HDL", "LDLCALC", "TRIG", "CHOLHDL", "LDLDIRECT" in the last 72 hours. Thyroid Function Tests: No results for input(s): "TSH", "T4TOTAL", "FREET4", "T3FREE", "THYROIDAB" in the last 72 hours. Anemia Panel: No results for input(s): "VITAMINB12", "FOLATE", "FERRITIN", "TIBC", "IRON", "RETICCTPCT" in the last 72 hours. Sepsis Labs: No results for input(s): "PROCALCITON", "LATICACIDVEN" in the last 168 hours.  Recent Results (from the past 240 hour(s))  Urine Culture     Status: Abnormal   Collection Time: 10/29/22 11:16 PM   Specimen: Urine, Clean Catch  Result Value Ref  Range Status   Specimen Description URINE, CLEAN CATCH  Final   Special Requests   Final    NONE Performed at [facility], [address], [place], [place] [address]    Culture >=100,000 COLONIES/mL KLEBSIELLA PNEUMONIAE (A)  Final   Report Status 11/01/2022 FINAL  Final   Organism ID, Bacteria KLEBSIELLA PNEUMONIAE (A)  Final      Susceptibility   Klebsiella pneumoniae - MIC*    AMPICILLIN >=32 RESISTANT Resistant     CEFAZOLIN <=4 SENSITIVE Sensitive     CEFEPIME <=0.12 SENSITIVE Sensitive     CEFTRIAXONE <=0.25 SENSITIVE Sensitive     CIPROFLOXACIN <=0.25 SENSITIVE Sensitive     GENTAMICIN <=1 SENSITIVE Sensitive     IMIPENEM <=0.25 SENSITIVE Sensitive     NITROFURANTOIN 128 RESISTANT Resistant     TRIMETH/SULFA <=20 SENSITIVE Sensitive     AMPICILLIN/SULBACTAM >=32 RESISTANT Resistant     PIP/TAZO 32 INTERMEDIATE Intermediate     * >=100,000 COLONIES/mL KLEBSIELLA PNEUMONIAE      Radiology Studies: No results found.    Scheduled Meds:  apixaban  5 mg Oral BID   atorvastatin  10 mg Oral Daily   carvedilol  12.5 mg Oral BID WC   DULoxetine  60 mg Oral Daily   furosemide  20 mg Oral BID   insulin aspart  0-20 Units Subcutaneous TID WC   insulin aspart  0-5 Units Subcutaneous QHS   mirtazapine  7.5 mg Oral QHS   Continuous Infusions:   ceFAZolin (ANCEF) IV 1 g (11/04/22 0429)     LOS: 5 days   Time spent: 25 minutes Noralee Stain, DO [facility] 11/04/2022, 11:29 AM   Available via Epic secure chat 7am-7pm After these hours, please refer to coverage provider listed on [email or web address]

## 2022-11-05 ENCOUNTER — Inpatient Hospital Stay (HOSPITAL_COMMUNITY): Payer: 59

## 2022-11-05 DIAGNOSIS — N390 Urinary tract infection, site not specified: Secondary | ICD-10-CM

## 2022-11-05 DIAGNOSIS — R0602 Shortness of breath: Secondary | ICD-10-CM

## 2022-11-05 DIAGNOSIS — J810 Acute pulmonary edema: Secondary | ICD-10-CM

## 2022-11-05 DIAGNOSIS — R319 Hematuria, unspecified: Secondary | ICD-10-CM

## 2022-11-05 LAB — BASIC METABOLIC PANEL
Anion gap: 10 (ref 5–15)
BUN: 32 mg/dL — ABNORMAL HIGH (ref 8–23)
CO2: 31 mmol/L (ref 22–32)
Calcium: 8.7 mg/dL — ABNORMAL LOW (ref 8.9–10.3)
Chloride: 97 mmol/L — ABNORMAL LOW (ref 98–111)
Creatinine, Ser: 1.22 mg/dL — ABNORMAL HIGH (ref 0.44–1.00)
GFR, Estimated: 46 mL/min — ABNORMAL LOW (ref >60)
Glucose, Bld: 125 mg/dL — ABNORMAL HIGH (ref 70–99)
Potassium: 4.5 mmol/L (ref 3.5–5.1)
Sodium: 138 mmol/L (ref 135–145)

## 2022-11-05 LAB — CBC
HCT: 30.1 % — ABNORMAL LOW (ref 36.0–46.0)
Hemoglobin: 8.9 g/dL — ABNORMAL LOW (ref 12.0–15.0)
MCH: 30.3 pg (ref 26.0–34.0)
MCHC: 29.6 g/dL — ABNORMAL LOW (ref 30.0–36.0)
MCV: 102.4 fL — ABNORMAL HIGH (ref 80.0–100.0)
Platelets: 192 10*3/uL (ref 150–400)
RBC: 2.94 MIL/uL — ABNORMAL LOW (ref 3.87–5.11)
RDW: 19.4 % — ABNORMAL HIGH (ref 11.5–15.5)
WBC: 3.7 10*3/uL — ABNORMAL LOW (ref 4.0–10.5)
nRBC: 0 % (ref 0.0–0.2)

## 2022-11-05 LAB — GLUCOSE, CAPILLARY
Glucose-Capillary: 122 mg/dL — ABNORMAL HIGH (ref 70–99)
Glucose-Capillary: 177 mg/dL — ABNORMAL HIGH (ref 70–99)
Glucose-Capillary: 104 mg/dL — ABNORMAL HIGH (ref 70–99)
Glucose-Capillary: 169 mg/dL — ABNORMAL HIGH (ref 70–99)

## 2022-11-05 LAB — FOLATE: Folate: 6.2 ng/mL (ref >5.9)

## 2022-11-05 LAB — VITAMIN B12: Vitamin B-12: 369 pg/mL (ref 180–914)

## 2022-11-05 MED ORDER — CALCIUM CARBONATE ANTACID 500 MG PO CHEW: 1.0000 | CHEWABLE_TABLET | Freq: Two times a day (BID) | ORAL | Status: DC | PRN

## 2022-11-05 NOTE — Progress Notes (Signed)
   11/05/22 2245  BiPAP/CPAP/SIPAP  $ Non-Invasive Home Ventilator  Subsequent  BiPAP/CPAP/SIPAP Pt Type Adult  BiPAP/CPAP/SIPAP Resmed  Mask Type Full face mask  Mask Size Medium  EPAP 6 cmH2O (paitent comfort)  Flow Rate 4 lpm  Patient Home Equipment No  Auto Titrate No  BiPAP/CPAP /SiPAP Vitals  Pulse Rate 84  Resp 18  SpO2 94 %  Bilateral Breath Sounds Diminished  MEWS Score/Color  MEWS Score 0  MEWS Score Color Green

## 2022-11-05 NOTE — Plan of Care (Signed)

## 2022-11-05 NOTE — Progress Notes (Signed)
Per charge nurse Maurine Minister, patient called out complaining of chest pain. I was assisting another patient at the time so Maurine Minister reached out to Dr. Arlean Hopping. An EKG was ordered and read a-fib. Patient has an history of this. Due to patient having a history and no other complications, no new orders were placed. Patient is stable at this time with no other concerns.

## 2022-11-05 NOTE — TOC Progression Note (Addendum)
Transition of Care [facility]) - Progression Note    Patient Details  Name: Sheryl Aguilar MRN: [id number] Date of Birth: 03-21-1946  Transition of Care [facility]) CM/SW Contact  Shaivi Rothschild A [place], [place] Phone Number: 11/05/2022, 12:15 PM  Clinical Narrative:     Update 1600 CSW received contact from [facility] regarding pt's insurance. They received updated PT notes. They said that pt would most likely be reviewed by Medical Director and update CSW regarding decision or possibility of Peer 2 Peer. Status pending.  TOC will continue to follow.   CSW sent updated PT/OT notes to [facility] as requested for insurance authorization.   Status for insurance is pending.   TOC will continue to follow.    Expected Discharge Plan: Skilled Nursing Facility Barriers to Discharge: Continued Medical Work up  Expected Discharge Plan and Services In-house Referral: Clinical Social Work Discharge Planning Services: CM Consult Post Acute Care Choice: Skilled Nursing Facility Living arrangements for the past 2 months: Apartment                   DME Agency: [employer] Date DME Agency Contacted: 10/31/22 Time DME Agency Contacted: 1650 Representative spoke with at DME Agency: Vaughan Basta, CM with [employer]             Social Determinants of Health (SDOH) Interventions SDOH Screenings   Food Insecurity: No Food Insecurity (11/01/2022)  Housing: Patient Declined (11/01/2022)  Transportation Needs: No Transportation Needs (10/30/2022)  Utilities: Not At Risk (10/30/2022)  Financial Resource Strain: Medium Risk (03/17/2022)   [facility] prior to [facility]/21/2022[facility] visits prior to 06/21/2022.  Physical Activity: Inactive (03/17/2022)   [facility] visits prior to 06/21/2022., [facility] visits prior to 06/21/2022.  Social  Connections: Socially Isolated (03/17/2022)   [facility] visits prior to 06/21/2022., [facility] visits prior to 06/21/2022.  Stress: No Stress Concern Present (03/17/2022)   [facility] visits prior to 06/21/2022., [facility] visits prior to 06/21/2022.  Tobacco Use: Low Risk  (10/29/2022)  Recent Concern: Tobacco Use - Medium Risk (10/24/2022)   [facility]    Readmission Risk Interventions    11/05/2022   12:14 PM 10/31/2022    4:52 PM  Readmission Risk Prevention Plan  Transportation Screening Complete Complete  PCP or Specialist Appt within 5-7 Days Complete Complete  Home Care Screening Complete Complete  Medication Review (RN CM) Complete Complete

## 2022-11-05 NOTE — Progress Notes (Signed)
[facility]                                                                              Sheryl Aguilar, is a 77 y.o. female, DOB - March 17, 1946, ZOX:[id number] Admit date - 10/29/2022    Outpatient Primary MD for the patient is Andreas Blower., MD  LOS - 6  days  Chief Complaint  Patient presents with   Shortness of Breath       Brief summary    Sheryl Aguilar is a 77 y.o. female with medical history significant for severe morbid obesity with BMI of 56, COPD, OSA on CPAP, chronic hypoxia on 3 L nasal cannula continuously, chronic HFrEF, permanent atrial fibrillation on eliquis, recently discharged from outside facility 10/28/22 after [facility].  She was treated for pneumonia.  Her hospital stay was complicated by GI bleed, for which she received blood transfusion and had an EGD.  Was also complicated by HCAP and fluid overload she was treated for HCAP and she had a thoracentesis.  The patient was discharged home on 7/9, due to payment issues for SNF placement.  At home, she was unable to get from bed to commode and remained extremely weak.  She ended up back at [facility].   She was diagnosed with CAUTI.  Foley catheter was removed and patient was started on Rocephin.  PT evaluated patient and recommended for SNF placement.  Assessment & Plan     Klebsiella pneumonia CAUTI, present on admission -Foley catheter was removed -Urine culture showing Klebsiella pneumonia, sensitive to cefazolin -Continue cefazolin x 7 days,  -Will transition to oral Keflex upon discharge   Generalized weakness and debility -Following significant hospital stay greater than 30 days at an outside facility -PT eval recommending SNF   CKD stage 3B -Baseline creatinine 1.4 -Creatinine currently stable at baseline   Diabetes mellitus type 2, well-controlled -A1c 6.1 -Continue sliding scale insulin while inpatient CBG (last 3)  Recent  Labs    11/04/22 1946 11/05/22 0738 11/05/22 1227  GLUCAP 140* 122* 177*      A-fib -Rate controlled, continue Coreg -Continue Eliquis -Overnight had chest pain, currently resolved, EKG showed atrial fibrillation otherwise no acute ST-T wave changes.   Hyperlipidemia -Continue Lipitor   Chronic systolic CHF -Without exacerbation, continue heart healthy diet with fluid restriction -On Lasix   Chronic hypoxemic respiratory failure -Uses 3 L nasal cannula at baseline   COPD -Currently no wheezing, stable.    OSA/OHS  -BiPAP nightly, encouraged compliance   Morbid obesity -Body mass index is 39.01 kg/m.   Recent hospitalization for GI bleed Macrocytic anemia -S/p [facility] 7[facility]: blood streaks in the oropharynx and a duodenal polyp which was biopsied  -S/p colonoscopy 7/5: irritation and oozing at the site of ileocolonic anastamosis s/p hemoclips as well as sub-centimeter descending colon polyps s/p polypectomy and hemoclip placement  -S/p laryngoscopy: normal laryngeal exam with minimal left nasal septal excoriation unlikely to cause significant drop in hemoglobin -H&H stable, will obtain B12, folate   Incidental finding need outpatient follow up -Complex  cystic lesion w/ calcifications on right side of thyroid gland measuring 2.7x1.7 cm. Follow up ultrasound is recommended   Obesity Estimated body mass index is 39.01 kg/m as calculated from the following:   Height as of this encounter: 5\' 4"  (1.626 m).   Weight as of this encounter: 103.1 kg.  Code Status: DNR DVT Prophylaxis:   apixaban (ELIQUIS) tablet 5 mg   Level of Care: Level of care: Telemetry Medical Family Communication:  Disposition Plan:      Remains inpatient appropriate: Awaiting SNF     Antimicrobials:   Anti-infectives (From admission, onward)    Start     Dose/Rate Route Frequency Ordered Stop   11/01/22 2000  ceFAZolin (ANCEF) IVPB 1 g/50 mL premix        1 g 100  mL/hr over 30 Minutes Intravenous Every 8 hours 11/01/22 1045 11/05/22 2359   10/30/22 2200  cefTRIAXone (ROCEPHIN) 2 g in sodium chloride 0.9 % 100 mL IVPB  Status:  Discontinued        2 g 200 mL/hr over 30 Minutes Intravenous Every 24 hours 10/30/22 0333 11/01/22 1029   10/30/22 0045  cefTRIAXone (ROCEPHIN) 2 g in sodium chloride 0.9 % 100 mL IVPB        2 g 200 mL/hr over 30 Minutes Intravenous  Once 10/30/22 0039 10/30/22 0230          Medications  apixaban  5 mg Oral BID   atorvastatin  10 mg Oral Daily   carvedilol  12.5 mg Oral BID WC   DULoxetine  60 mg Oral Daily   furosemide  20 mg Oral BID   insulin aspart  0-20 Units Subcutaneous TID WC   insulin aspart  0-5 Units Subcutaneous QHS   mirtazapine  7.5 mg Oral QHS      Subjective:   Dashanti Burr was seen and examined today.  Overnight issues noted, had chest pain, currently resolved.  No acute issues.  No nausea vomiting, abdominal pain, fevers.   Objective:   Vitals:   11/04/22 1630 11/04/22 1943 11/05/22 0436 11/05/22 0740  BP: 132/77 122/64 (!) 126/59 121/63  Pulse: 97 90 94 88  Resp:  18 17 18   Temp: 97.9 F (36.6 C) 98.8 F (37.1 C) 98.2 F (36.8 C) 97.7 F (36.5 C)  TempSrc: Oral   Oral  SpO2: 99% 99% 100% 97%  Weight:      Height:        Intake/Output Summary (Last 24 hours) at 11/05/2022 1236 Last data filed at 11/05/2022 1215 Gross per 24 hour  Intake 647 ml  Output 1450 ml  Net -803 ml     Wt Readings from Last 3 Encounters:  11/04/22 103.1 kg  01/15/22 (!) 138 kg  01/24/21 (!) 138.3 kg     Exam General: Alert and oriented x 3, NAD Cardiovascular: S1 S2 auscultated,  RRR Respiratory: Clear to auscultation bilaterally, no wheezing Gastrointestinal: Soft, nontender, nondistended, + bowel sounds Ext: no pedal edema bilaterally Neuro: Strength 5/5 upper and lower extremities bilaterally Psych: Normal affect     Data Reviewed:  I have personally reviewed following labs     CBC Lab Results  Component Value Date   WBC 3.7 (L) 11/05/2022   RBC 2.94 (L) 11/05/2022   HGB 8.9 (L) 11/05/2022   HCT 30.1 (L) 11/05/2022   MCV 102.4 (H) 11/05/2022   MCH 30.3 11/05/2022   PLT 192 11/05/2022   MCHC 29.6 (L) 11/05/2022   RDW 19.4 (H)  11/05/2022   LYMPHSABS 0.9 10/29/2022   MONOABS 0.4 10/29/2022   EOSABS 0.2 10/29/2022   BASOSABS 0.0 10/29/2022     Last metabolic panel Lab Results  Component Value Date   NA 138 11/05/2022   K 4.5 11/05/2022   CL 97 (L) 11/05/2022   CO2 31 11/05/2022   BUN 32 (H) 11/05/2022   CREATININE 1.22 (H) 11/05/2022   GLUCOSE 125 (H) 11/05/2022   GFRNONAA 46 (L) 11/05/2022   GFRAA 57 (L) 12/07/2017   CALCIUM 8.7 (L) 11/05/2022   PHOS 4.3 10/30/2022   PROT 7.5 01/15/2022   ALBUMIN 3.5 01/15/2022   BILITOT 1.7 (H) 01/15/2022   ALKPHOS 70 01/15/2022   AST 21 01/15/2022   ALT 19 01/15/2022   ANIONGAP 10 11/05/2022    CBG (last 3)  Recent Labs    11/04/22 1946 11/05/22 0738 11/05/22 1227  GLUCAP 140* 122* 177*      Coagulation Profile: No results for input(s): "INR", "PROTIME" in the last 168 hours.   Radiology Studies: I have personally reviewed the imaging studies  DG Chest Port 1 View  Result Date: 11/05/2022 CLINICAL DATA:  Chest pain. EXAM: PORTABLE CHEST 1 VIEW COMPARISON:  Chest x-ray 10/29/2022. FINDINGS: Patchy opacities in both lung bases and the right upper lung. Enlarged cardiac silhouette. No visible pleural effusions or pneumothorax. IMPRESSION: 1. Mild opacities in both lung bases and the right upper lung, which could represent edema and/or multifocal pneumonia. 2. Cardiomegaly. Electronically Signed   By: Feliberto Harts M.D.   On: 11/05/2022 09:58       Talayla Doyel M.D. [facility] 11/05/2022, 12:36 PM  Available via Epic secure chat 7am-7pm After 7 pm, please refer to night coverage provider listed on amion.

## 2022-11-05 NOTE — Progress Notes (Addendum)
TR[facility] night cross cover note:   I was notified by RN that the patient is experiencing some chest discomfort, but otherwise no acute complaints.  Vital signs appear stable: Afebrile; heart rates in the 90s; blood pressure 126/59, respiratory rate 17, oxygen saturation 100% on 3 to 4 L nasal cannula, which appears to be her baseline in the setting of a history of chronic hypoxic respiratory failure.  Will check EKG at this time.  Prn acetaminophen order noted.    Update: In the context of a documented history of permanent atrial fibrillation, EKG shows atrial fibrillation with heart rate 89, and no evidence of T wave or ST changes, including no evidence of ST elevation.  Chest pain has been intermittent and patient conveys that it has spontaneously improved and is currently chest pain free. She is chronically anticoagulated on Eliquis in the setting of her permanent atrial fibrillation.  I am also ordering a chest x-ray in addition to as needed Tums for reflux.    Newton Pigg, DO Hospitalist

## 2022-11-06 DIAGNOSIS — R319 Hematuria, unspecified: Secondary | ICD-10-CM | POA: Diagnosis not present

## 2022-11-06 DIAGNOSIS — J810 Acute pulmonary edema: Secondary | ICD-10-CM | POA: Diagnosis not present

## 2022-11-06 DIAGNOSIS — N390 Urinary tract infection, site not specified: Secondary | ICD-10-CM | POA: Diagnosis not present

## 2022-11-06 DIAGNOSIS — R0602 Shortness of breath: Secondary | ICD-10-CM | POA: Diagnosis not present

## 2022-11-06 LAB — BASIC METABOLIC PANEL
Anion gap: 5 (ref 5–15)
BUN: 28 mg/dL — ABNORMAL HIGH (ref 8–23)
CO2: 33 mmol/L — ABNORMAL HIGH (ref 22–32)
Calcium: 8.4 mg/dL — ABNORMAL LOW (ref 8.9–10.3)
Chloride: 98 mmol/L (ref 98–111)
Creatinine, Ser: 1.19 mg/dL — ABNORMAL HIGH (ref 0.44–1.00)
GFR, Estimated: 47 mL/min — ABNORMAL LOW (ref >60)
Glucose, Bld: 128 mg/dL — ABNORMAL HIGH (ref 70–99)
Potassium: 4.3 mmol/L (ref 3.5–5.1)
Sodium: 136 mmol/L (ref 135–145)

## 2022-11-06 LAB — CBC
HCT: 27.9 % — ABNORMAL LOW (ref 36.0–46.0)
Hemoglobin: 8.2 g/dL — ABNORMAL LOW (ref 12.0–15.0)
MCH: 30.1 pg (ref 26.0–34.0)
MCHC: 29.4 g/dL — ABNORMAL LOW (ref 30.0–36.0)
MCV: 102.6 fL — ABNORMAL HIGH (ref 80.0–100.0)
Platelets: 189 10*3/uL (ref 150–400)
RBC: 2.72 MIL/uL — ABNORMAL LOW (ref 3.87–5.11)
RDW: 19.2 % — ABNORMAL HIGH (ref 11.5–15.5)
WBC: 3.7 10*3/uL — ABNORMAL LOW (ref 4.0–10.5)
nRBC: 0.5 % — ABNORMAL HIGH (ref 0.0–0.2)

## 2022-11-06 LAB — GLUCOSE, CAPILLARY
Glucose-Capillary: 138 mg/dL — ABNORMAL HIGH (ref 70–99)
Glucose-Capillary: 117 mg/dL — ABNORMAL HIGH (ref 70–99)
Glucose-Capillary: 130 mg/dL — ABNORMAL HIGH (ref 70–99)
Glucose-Capillary: 158 mg/dL — ABNORMAL HIGH (ref 70–99)

## 2022-11-06 MED ORDER — OXYCODONE-ACETAMINOPHEN 10-325 MG PO TABS: 1.0000 | ORAL_TABLET | ORAL | 0 refills | Status: AC | PRN

## 2022-11-06 NOTE — Progress Notes (Signed)
   11/06/22 2239  BiPAP/CPAP/SIPAP  Reason BIPAP/CPAP not in use Non-compliant  BiPAP/CPAP /SiPAP Vitals  Pulse Rate 88  Resp 18  SpO2 99 %  Bilateral Breath Sounds Diminished  MEWS Score/Color  MEWS Score 0  MEWS Score Color [place] Si

## 2022-11-06 NOTE — Progress Notes (Signed)
Physical Therapy Treatment Patient Details Name: Sheryl Aguilar MRN: [id number] DOB: Jun 17, 1945 Today's Date: 11/06/2022   History of Present Illness 77 y.o. female admitted 7/10 with SOB, weakness. PMHx: pt D/Cd from [facility] 10/28/22 after 6 week stay for PNA and GIB. Severe morbid obesity, COPD, OSA on CPAP, chronic hypoxia on 3 L, HFrEF, Afib, chronic pain, breast CA    PT Comments  Pt greeted up in chair with NT and RN present in room, requesting assist to return to bed. Pt with increased fatigue this session from sitting up in chair and requiring mod A +2 to rise from chair to stand. Pt with heavy anterior lean on stedy rail needing cues to elevate trunk with pt able to correct. Pt needing min A +2 to rise from stedy seat and min A to lower with control to sitting on low EOB. Educated pt on importance of time up OOB everyday with pt verbalizing understanding. Current plan remains appropriate to address deficits and maximize functional independence and decrease caregiver burden. Pt continues to benefit from skilled PT services to progress toward functional mobility goals.      Assistance Recommended at Discharge Frequent or constant Supervision/Assistance  If plan is discharge home, recommend the following:  Can travel by private vehicle    A lot of help with walking and/or transfers;A lot of help with bathing/dressing/bathroom;Assistance with cooking/housework;Direct supervision/assist for medications management;Assist for transportation;Help with stairs or ramp for entrance   No  Equipment Recommendations  None recommended by PT    Recommendations for Other Services       Precautions / Restrictions Precautions Precautions: Fall Restrictions Weight Bearing Restrictions: No     Mobility  Bed Mobility Overal bed mobility: Needs Assistance Bed Mobility: Sit to Supine Rolling: Supervision     Sit to supine: Supervision   General bed mobility comments: supervision to return  to flat bed    Transfers Overall transfer level: Needs assistance Equipment used: Ambulation equipment used Antony Salmon) Transfers: Sit to/from Stand, Bed to chair/wheelchair/BSC Sit to Stand: +2 physical assistance, Mod assist           General transfer comment: mod A +2 to stand from recliner due to fatigue from sitting up, cues for upright posture as pt leaning heavily on stedy rail, min A +2 to stand from stedy flaps, cues for controlled descent to sitting EOB with assist needed to lower slowly Transfer via Lift Equipment: Stedy  Ambulation/Gait                   Stairs             Wheelchair Mobility     Tilt Bed    Modified Rankin (Stroke Patients Only)       Balance Overall balance assessment: Needs assistance Sitting-balance support: No upper extremity supported, Feet supported, Bilateral upper extremity supported Sitting balance-Leahy Scale: Fair Sitting balance - Comments: able to perform self feeding, grooming and UE HEP seated on EOB   Standing balance support: Bilateral upper extremity supported, Reliant on assistive device for balance Standing balance-Leahy Scale: Poor Standing balance comment: reliance on stedy bar                            Cognition Arousal/Alertness: Awake/alert Behavior During Therapy: WFL for tasks assessed/performed Overall Cognitive Status: Impaired/Different from baseline Area of Impairment: Safety/judgement, Awareness, Problem solving  Memory: Decreased short-term memory   Safety/Judgement: Decreased awareness of safety Awareness: Anticipatory Problem Solving: Slow processing, Requires verbal cues General Comments: required encouragement to come to stand        Exercises General Exercises - Lower Extremity Long Arc Quad: AROM, Both, Seated, 10 reps Hip Flexion/Marching: AROM, Both, Seated, 10 reps    General Comments General comments (skin integrity, edema, etc.):  VSS on supplemental O2      Pertinent Vitals/Pain Pain Assessment Pain Assessment: Faces Faces Pain Scale: Hurts a little bit Pain Location: LEs with mobility Pain Descriptors / Indicators: Sore, Grimacing Pain Intervention(s): Monitored during session, Limited activity within patient's tolerance    Home Living                          Prior Function            PT Goals (current goals can now be found in the care plan section) Acute Rehab PT Goals Patient Stated Goal: to get back to bed PT Goal Formulation: With patient Time For Goal Achievement: 11/13/22 Progress towards PT goals: Progressing toward goals    Frequency    Min 2X/week      PT Plan Current plan remains appropriate    Co-evaluation              AM-PAC PT "6 Clicks" Mobility   Outcome Measure  Help needed turning from your back to your side while in a flat bed without using bedrails?: A Little Help needed moving from lying on your back to sitting on the side of a flat bed without using bedrails?: A Little Help needed moving to and from a bed to a chair (including a wheelchair)?: Total Help needed standing up from a chair using your arms (e.g., wheelchair or bedside chair)?: Total Help needed to walk in hospital room?: Total Help needed climbing 3-5 steps with a railing? : Total 6 Click Score: 10    End of Session Equipment Utilized During Treatment: Oxygen Activity Tolerance: Patient tolerated treatment well Patient left: in bed;with call bell/phone within reach;with nursing/sitter in room Nurse Communication: Mobility status PT Visit Diagnosis: Other abnormalities of gait and mobility (R26.89);Muscle weakness (generalized) (M62.81)     Time: 9147-8295 PT Time Calculation (min) (ACUTE ONLY): 11 min  Charges:    $Therapeutic Activity: 8-22 mins PT General Charges $$ ACUTE PT VISIT: 1 Visit                     Bartt Gonzaga R. PTA Acute Rehabilitation Services Office:  [phone]   Catalina Antigua 11/06/2022, 4:27 PM

## 2022-11-06 NOTE — Progress Notes (Signed)
Physical Therapy Treatment Patient Details Name: Sheryl Aguilar MRN: [id number] DOB: 1945-10-26 Today's Date: 11/06/2022   History of Present Illness 77 y.o. female admitted 7/10 with SOB, weakness. PMHx: pt D/Cd from [facility] 10/28/22 after 6 week stay for PNA and GIB. Severe morbid obesity, COPD, OSA on CPAP, chronic hypoxia on 3 L, HFrEF, Afib, chronic pain, breast CA    PT Comments  Pt greeted resting in bed and agreeable to PT/OT session with focus on progression of functional transfers and standing tolerance. Pt able to complete bed mobility with supervision for safety and use of bed features. Pt able to come to stand x4 throughout session with min A +2 to rise and cues for upright posture. Pt performing LE therex seated up in recliner with cues for technique. Pt continues to be limited by LE weakness, poor balance/postural reactions, and decreased activity tolerance continues to benefit from skilled PT services to progress toward functional mobility goals, and current recommendation continues to remain appropriate.     Assistance Recommended at Discharge Frequent or constant Supervision/Assistance  If plan is discharge home, recommend the following:  Can travel by private vehicle    A lot of help with walking and/or transfers;A lot of help with bathing/dressing/bathroom;Assistance with cooking/housework;Direct supervision/assist for medications management;Assist for transportation;Help with stairs or ramp for entrance   No  Equipment Recommendations  None recommended by PT    Recommendations for Other Services       Precautions / Restrictions Precautions Precautions: Fall Restrictions Weight Bearing Restrictions: No     Mobility  Bed Mobility Overal bed mobility: Needs Assistance Bed Mobility: Supine to Sit Rolling: Supervision         General bed mobility comments: no physical assistance needed to get to EOB, use of bed features    Transfers Overall transfer level:  Needs assistance Equipment used: Ambulation equipment used Antony Salmon) Transfers: Sit to/from Stand, Bed to chair/wheelchair/BSC Sit to Stand: +2 physical assistance, Min assist           General transfer comment: min A +2 to come to stand from elevated EOB with use of bedpad to boost, light min A to stand from stedy flaps x2, anc min A to stand from recliner, cues for upright trunk and forward gaze in standing Transfer via Lift Equipment: Stedy  Ambulation/Gait                   Stairs             Wheelchair Mobility     Tilt Bed    Modified Rankin (Stroke Patients Only)       Balance Overall balance assessment: Needs assistance Sitting-balance support: No upper extremity supported, Feet supported, Bilateral upper extremity supported Sitting balance-Leahy Scale: Fair Sitting balance - Comments: able to perform self feeding, grooming and UE HEP seated on EOB   Standing balance support: Bilateral upper extremity supported, Reliant on assistive device for balance Standing balance-Leahy Scale: Poor Standing balance comment: reliance on stedy bar                            Cognition Arousal/Alertness: Awake/alert Behavior During Therapy: WFL for tasks assessed/performed Overall Cognitive Status: Impaired/Different from baseline Area of Impairment: Safety/judgement, Awareness, Problem solving                     Memory: Decreased short-term memory   Safety/Judgement: Decreased awareness of safety Awareness: Anticipatory Problem Solving:  Slow processing, Requires verbal cues General Comments: following commands without difficulty or delay        Exercises General Exercises - Lower Extremity Long Arc Quad: AROM, Both, Seated, 10 reps Hip Flexion/Marching: AROM, Both, Seated, 10 reps    General Comments General comments (skin integrity, edema, etc.): VSS on supplemental O2, HR up to 101bpm with activity      Pertinent Vitals/Pain  Pain Assessment Pain Assessment: Faces Faces Pain Scale: Hurts a little bit Pain Location: LEs with mobility Pain Descriptors / Indicators: Sore, Grimacing Pain Intervention(s): Monitored during session, Limited activity within patient's tolerance, Repositioned    Home Living                          Prior Function            PT Goals (current goals can now be found in the care plan section) Acute Rehab PT Goals Patient Stated Goal: go to rehab then return home PT Goal Formulation: With patient Time For Goal Achievement: 11/13/22 Progress towards PT goals: Progressing toward goals    Frequency    Min 2X/week      PT Plan Current plan remains appropriate    Co-evaluation PT/OT/SLP Co-Evaluation/Treatment: Yes Reason for Co-Treatment: For patient/therapist safety;To address functional/ADL transfers PT goals addressed during session: Mobility/safety with mobility;Proper use of DME;Strengthening/ROM OT goals addressed during session: ADL's and self-care      AM-PAC PT "6 Clicks" Mobility   Outcome Measure  Help needed turning from your back to your side while in a flat bed without using bedrails?: A Little Help needed moving from lying on your back to sitting on the side of a flat bed without using bedrails?: A Little Help needed moving to and from a bed to a chair (including a wheelchair)?: Total Help needed standing up from a chair using your arms (e.g., wheelchair or bedside chair)?: Total Help needed to walk in hospital room?: Total Help needed climbing 3-5 steps with a railing? : Total 6 Click Score: 10    End of Session Equipment Utilized During Treatment: Oxygen Activity Tolerance: Patient tolerated treatment well Patient left: with call bell/phone within reach;in chair;with chair alarm set Nurse Communication: Mobility status;Need for lift equipment;Other (comment) (need for +2 to return to bed) PT Visit Diagnosis: Other abnormalities of gait and  mobility (R26.89);Muscle weakness (generalized) (M62.81)     Time: 1610-9604 PT Time Calculation (min) (ACUTE ONLY): 25 min  Charges:    $Therapeutic Activity: 8-22 mins PT General Charges $$ ACUTE PT VISIT: 1 Visit                     Jaysin Gayler R. PTA Acute Rehabilitation Services Office: [phone]   Catalina Antigua 11/06/2022, 11:44 AM

## 2022-11-06 NOTE — Progress Notes (Signed)
Occupational Therapy Treatment Patient Details Name: Sheryl Aguilar MRN: [id number] DOB: 24-Jul-1945 Today's Date: 11/06/2022   History of present illness 77 y.o. female admitted 7/10 with SOB, weakness. PMHx: pt D/Cd from [facility] 10/28/22 after 6 week stay for PNA and GIB. Severe morbid obesity, COPD, OSA on CPAP, chronic hypoxia on 3 L, HFrEF, Afib, chronic pain, breast CA   OT comments  Patient pleasant and agreeable to OT/PT session but apprehensive on use of Stedy. Patient continues to demonstrate gains with supervision to get  to EOB and able to stand into stedy with min assist +2 and 2 more stands from stedy pad before sitting in recliner. Patient performed BUE HEP with red therapy band demonstrating good understanding of exercises. Patient pleased with self for being able to perform transfer and standing in [place]. Patient will benefit from continued inpatient follow up therapy, <3 hours/day to increase independence with bathing, dressing, and functional transfers. Acute OT to continue to follow.    Recommendations for follow up therapy are one component of a multi-disciplinary discharge planning process, led by the attending physician.  Recommendations may be updated based on patient status, additional functional criteria and insurance authorization.    Assistance Recommended at Discharge Frequent or constant Supervision/Assistance  Patient can return home with the following  Two people to help with walking and/or transfers;Two people to help with bathing/dressing/bathroom;A lot of help with bathing/dressing/bathroom;Assistance with cooking/housework;Assist for transportation;Help with stairs or ramp for entrance   Equipment Recommendations  Other (comment) (defer)    Recommendations for Other Services      Precautions / Restrictions Precautions Precautions: Fall Restrictions Weight Bearing Restrictions: No       Mobility Bed Mobility Overal bed mobility: Needs Assistance Bed  Mobility: Supine to Sit Rolling: Supervision         General bed mobility comments: able to get to EOB without assistance    Transfers Overall transfer level: Needs assistance Equipment used: Ambulation equipment used Antony Salmon) Transfers: Sit to/from Stand, Bed to chair/wheelchair/BSC Sit to Stand: +2 physical assistance, Min assist           General transfer comment: min assist +2 to stand from EOB into stedy and performed 2 stands from stedy pads Transfer via Lift Equipment: Stedy   Balance Overall balance assessment: Needs assistance Sitting-balance support: No upper extremity supported, Feet supported, Bilateral upper extremity supported Sitting balance-Leahy Scale: Fair     Standing balance support: Bilateral upper extremity supported, Reliant on assistive device for balance Standing balance-Leahy Scale: Poor Standing balance comment: reliance on stedy bar                           ADL either performed or assessed with clinical judgement   ADL Overall ADL's : Needs assistance/impaired     Grooming: Set up;Sitting Grooming Details (indicate cue type and reason): in recliner                                    Extremity/Trunk Assessment              Vision       Perception     Praxis      Cognition Arousal/Alertness: Awake/alert Behavior During Therapy: WFL for tasks assessed/performed Overall Cognitive Status: Impaired/Different from baseline Area of Impairment: Safety/judgement, Awareness, Problem solving  Memory: Decreased short-term memory   Safety/Judgement: Decreased awareness of safety Awareness: Anticipatory Problem Solving: Slow processing, Requires verbal cues General Comments: required encouragement to attempt standing due fear he LEs will not hold her.        Exercises Exercises: General Upper Extremity General Exercises - Upper Extremity Shoulder Flexion: Strengthening, Both, 10  reps, Seated, Theraband Theraband Level (Shoulder Flexion): Level 2 (Red) Shoulder Horizontal ABduction: Strengthening, Both, 15 reps, Seated, Theraband Theraband Level (Shoulder Horizontal Abduction): Level 2 (Red) Elbow Flexion: Strengthening, Both, 15 reps, Seated, Theraband Theraband Level (Elbow Flexion): Level 2 (Red) Elbow Extension: Strengthening, Both, 15 reps, Seated, Theraband Theraband Level (Elbow Extension): Level 2 (Red)    Shoulder Instructions       General Comments VSS on supplemental O2, HR up to 101bpm with activity    Pertinent Vitals/ Pain       Pain Assessment Pain Assessment: Faces Faces Pain Scale: Hurts a little bit Pain Location: LEs with mobility Pain Descriptors / Indicators: Sore, Grimacing Pain Intervention(s): Limited activity within patient's tolerance, Monitored during session, Repositioned  Home Living                                          Prior Functioning/Environment              Frequency  Min 2X/week        Progress Toward Goals  OT Goals(current goals can now be found in the care plan section)  Progress towards OT goals: Progressing toward goals  Acute Rehab OT Goals Patient Stated Goal: get better OT Goal Formulation: With patient Time For Goal Achievement: 11/13/22 Potential to Achieve Goals: Fair ADL Goals Pt Will Perform Grooming: with modified independence;sitting Pt Will Perform Lower Body Dressing: sit to/from stand;with mod assist Pt Will Transfer to Toilet: with min assist;with transfer board Pt/caregiver will Perform Home Exercise Program: Both right and left upper extremity;Increased strength;With written HEP provided Additional ADL Goal #1: Pt will static stand 1+ min as precursor to engagemet in standing ADL  Plan Discharge plan remains appropriate    Co-evaluation    PT/OT/SLP Co-Evaluation/Treatment: Yes Reason for Co-Treatment: For patient/therapist safety;To address  functional/ADL transfers PT goals addressed during session: Mobility/safety with mobility;Proper use of DME;Strengthening/ROM OT goals addressed during session: ADL's and self-care;Strengthening/ROM      AM-PAC OT "6 Clicks" Daily Activity     Outcome Measure   Help from another person eating meals?: None Help from another person taking care of personal grooming?: A Little Help from another person toileting, which includes using toliet, bedpan, or urinal?: Total Help from another person bathing (including washing, rinsing, drying)?: A Lot Help from another person to put on and taking off regular upper body clothing?: A Little Help from another person to put on and taking off regular lower body clothing?: Total 6 Click Score: 14    End of Session    OT Visit Diagnosis: Unsteadiness on feet (R26.81);Muscle weakness (generalized) (M62.81);Other abnormalities of gait and mobility (R26.89)   Activity Tolerance     Patient Left     Nurse Communication          Time: 1610-9604 OT Time Calculation (min): 24 min  Charges: OT General Charges $OT Visit: 1 Visit OT Treatments $Therapeutic Activity: 8-22 mins  Alfonse Flavors, OTA Acute Rehabilitation Services  Office [phone]   Dewain Penning 11/06/2022, 12:09 PM

## 2022-11-06 NOTE — Progress Notes (Signed)
[facility]                                                                              Sheryl Aguilar, is a 77 y.o. female, DOB - 11-09-1945, GNF:[id number] Admit date - 10/29/2022    Outpatient Primary MD for the patient is Andreas Blower., MD  LOS - 7  days  Chief Complaint  Patient presents with   Shortness of Breath       Brief summary    Sheryl Aguilar is a 77 y.o. female with medical history significant for severe morbid obesity with BMI of 56, COPD, OSA on CPAP, chronic hypoxia on 3 L nasal cannula continuously, chronic HFrEF, permanent atrial fibrillation on eliquis, recently discharged from outside facility 10/28/22 after 6 weeks [facility] stay [facility].  She was treated for pneumonia.  Her hospital stay was complicated by GI bleed, for which she received blood transfusion and had an EGD.  Was also complicated by HCAP and fluid overload she was treated for HCAP and she had a thoracentesis.  The patient was discharged home on 7/9, due to payment issues for SNF placement.  At home, she was unable to get from bed to commode and remained extremely weak.  She ended up back at [facility].   She was diagnosed with CAUTI.  Foley catheter was removed and patient was started on Rocephin.  PT evaluated patient and recommended for SNF placement.  Assessment & Plan     Klebsiella pneumonia CAUTI, present on admission -Foley catheter was removed -Urine culture showing Klebsiella pneumonia, sensitive to cefazolin -Completed cefazolin on 7/17   Generalized weakness and debility -Following significant hospital stay greater than 30 days at an outside facility -PT eval recommending SNF   CKD stage 3B -Baseline creatinine 1.4 -Creatinine currently stable at baseline, creatinine 1.1   Diabetes mellitus type 2, well-controlled -A1c 6.1 CBG (last 3)  Recent Labs    11/05/22 2056 11/06/22 0743 11/06/22 1123  GLUCAP 169* 138* 117*    -CBG stable, continue SSI   A-fib -Rate controlled, continue Coreg -Continue Eliquis   Hyperlipidemia -Continue Lipitor   Chronic systolic CHF -Without exacerbation, continue heart healthy diet with fluid restriction -On Lasix   Chronic hypoxemic respiratory failure -Uses 3 L nasal cannula at baseline   COPD -Currently no wheezing, stable.    OSA/OHS  -BiPAP nightly, encouraged compliance   Morbid obesity -Body mass index is 39.01 kg/m.   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 Follow up ultrasound is recommended   Obesity Estimated body mass index is  39.01 kg/m as calculated from the following:   Height as of this encounter: 5\' 4"  (1.626 m).   Weight as of this encounter: 103.1 kg.  Code Status: DNR DVT Prophylaxis:   apixaban (ELIQUIS) tablet 5 mg   Level of Care: Level of care: Telemetry Medical Family Communication:  Disposition Plan:      Remains inpatient appropriate: Awaiting SNF     Antimicrobials:   Anti-infectives (From admission, onward)    Start     Dose/Rate Route Frequency Ordered Stop   11/01/22 2000  ceFAZolin (ANCEF) IVPB 1 g/50 mL premix        1 g 100 mL/hr over 30 Minutes Intravenous Every 8 hours 11/01/22 1045 11/05/22 2053   10/30/22 2200  cefTRIAXone (ROCEPHIN) 2 g in sodium chloride 0.9 % 100 mL IVPB  Status:   Discontinued        2 g 200 mL/hr over 30 Minutes Intravenous Every 24 hours 10/30/22 0333 11/01/22 1029   10/30/22 0045  cefTRIAXone (ROCEPHIN) 2 g in sodium chloride 0.9 % 100 mL IVPB        2 g 200 mL/hr over 30 Minutes Intravenous  Once 10/30/22 0039 10/30/22 0230          Medications  apixaban  5 mg Oral BID   atorvastatin  10 mg Oral Daily   carvedilol  12.5 mg Oral BID WC   DULoxetine  60 mg Oral Daily   furosemide  20 mg Oral BID   insulin aspart  0-20 Units Subcutaneous TID WC   insulin aspart  0-5 Units Subcutaneous QHS   mirtazapine  7.5 mg Oral QHS      Subjective:   Sheryl Aguilar was seen and examined today.  No acute issues.  No nausea vomiting, abdominal pain, fevers or any chest pain.    Vitals:   11/06/22 0413 11/06/22 0500 11/06/22 0741 11/06/22 1121  BP: (!) 109/54  120/74 123/70  Pulse: 90  92 84  Resp: 18  18 20   Temp: 100.3 F (37.9 C)  97.7 F (36.5 C) (!) 97.5 F (36.4 C)  TempSrc:    Oral  SpO2: 97%  96% 98%  Weight:  103.1 kg    Height:        Intake/Output Summary (Last 24 hours) at 11/06/2022 1436 Last data filed at 11/06/2022 1341 Gross per 24 hour  Intake 404 ml  Output 1800 ml  Net -1396 ml     Wt Readings from Last 3 Encounters:  11/06/22 103.1 kg  01/15/22 (!) 138 kg  01/24/21 (!) 138.3 kg   Physical Exam General: Alert and oriented x 3, NAD Cardiovascular: S1 S2 clear, RRR.  Respiratory: CTAB, no wheezing Gastrointestinal: Soft, nontender, nondistended, NBS Ext: no pedal edema bilaterally Neuro: no new deficits Psych: Normal affect    Data Reviewed:  I have personally reviewed following labs    CBC Lab Results  Component Value Date   WBC 3.7 (L) 11/05/2022   RBC 2.72 (L) 11/05/2022   HGB 8.2 (L) 11/05/2022   HCT 27.9 (L) 11/05/2022   MCV 102.6 (H) 11/05/2022   MCH 30.1 11/05/2022   PLT 189 11/05/2022   MCHC 29.4 (L) 11/05/2022   RDW 19.2 (H) 11/05/2022   LYMPHSABS 0.9 10/29/2022   MONOABS 0.4  10/29/2022   EOSABS 0.2 10/29/2022   BASOSABS 0.0 10/29/2022     Last metabolic panel Lab Results  Component Value Date   NA 136 11/05/2022   K 4.3 11/05/2022  CL 98 11/05/2022   CO2 33 (H) 11/05/2022   BUN 28 (H) 11/05/2022   CREATININE 1.19 (H) 11/05/2022   GLUCOSE 128 (H) 11/05/2022   GFRNONAA 47 (L) 11/05/2022   GFRAA 57 (L) 12/07/2017   CALCIUM 8.4 (L) 11/05/2022   PHOS 4.3 10/30/2022   PROT 7.5 01/15/2022   ALBUMIN 3.5 01/15/2022   BILITOT 1.7 (H) 01/15/2022   ALKPHOS 70 01/15/2022   AST 21 01/15/2022   ALT 19 01/15/2022   ANIONGAP 5 11/05/2022    CBG (last 3)  Recent Labs    11/05/22 2056 11/06/22 0743 11/06/22 1123  GLUCAP 169* 138* 117*      Coagulation Profile: No results for input(s): "INR", "PROTIME" in the last 168 hours.   Radiology Studies: I have personally reviewed the imaging studies  DG Chest Port 1 View  Result Date: 11/05/2022 CLINICAL DATA:  Chest pain. EXAM: PORTABLE CHEST 1 VIEW COMPARISON:  Chest x-ray 10/29/2022. FINDINGS: Patchy opacities in both lung bases and the right upper lung. Enlarged cardiac silhouette. No visible pleural effusions or pneumothorax. IMPRESSION: 1. Mild opacities in both lung bases and the right upper lung, which could represent edema and/or multifocal pneumonia. 2. Cardiomegaly. Electronically Signed   By: Feliberto Harts M.D.   On: 11/05/2022 09:58       Eidan Muellner M.D. [facility] 11/06/2022, 2:36 PM  Available via Epic secure chat 7am-7pm After 7 pm, please refer to night coverage provider listed on amion.

## 2022-11-07 DIAGNOSIS — R0602 Shortness of breath: Secondary | ICD-10-CM | POA: Diagnosis not present

## 2022-11-07 DIAGNOSIS — J810 Acute pulmonary edema: Secondary | ICD-10-CM | POA: Diagnosis not present

## 2022-11-07 DIAGNOSIS — N390 Urinary tract infection, site not specified: Secondary | ICD-10-CM | POA: Diagnosis not present

## 2022-11-07 DIAGNOSIS — R319 Hematuria, unspecified: Secondary | ICD-10-CM | POA: Diagnosis not present

## 2022-11-07 LAB — GLUCOSE, CAPILLARY
Glucose-Capillary: 127 mg/dL — ABNORMAL HIGH (ref 70–99)
Glucose-Capillary: 112 mg/dL — ABNORMAL HIGH (ref 70–99)
Glucose-Capillary: 146 mg/dL — ABNORMAL HIGH (ref 70–99)
Glucose-Capillary: 102 mg/dL — ABNORMAL HIGH (ref 70–99)

## 2022-11-07 NOTE — Progress Notes (Signed)
   11/07/22 2254  BiPAP/CPAP/SIPAP  Reason BIPAP/CPAP not in use Non-compliant   Pt refused CPAP for night time use.

## 2022-11-07 NOTE — TOC Progression Note (Signed)
Transition of Care [facility]) - Progression Note    Patient Details  Name: Sheryl Aguilar MRN: [id number] Date of Birth: August 07, 1945  Transition of [facility]) CM/SW Contact  Tonetta Napoles A [place], [place] Phone Number: 11/07/2022, 10:34 AM  Clinical Narrative:     CSW sent updated PT notes to community care transitions for insurance authorization. Status pending.  TOC will continue to follow.   Expected Discharge Plan: Skilled Nursing Facility Barriers to Discharge: Continued Medical Work up  Expected Discharge Plan and Services In-house Referral: Clinical Social Work Discharge Planning Services: CM Consult Post Acute Care Choice: Skilled Nursing Facility Living arrangements for the past 2 months: Apartment                   DME Agency: [employer] Date DME Agency Contacted: 10/31/22 Time DME Agency Contacted: 1650 Representative spoke with at DME Agency: Vaughan Basta, CM with [employer]             Social Determinants of Health (SDOH) Interventions SDOH Screenings   Food Insecurity: No Food Insecurity (11/01/2022)  Housing: Patient Declined (11/01/2022)  Transportation Needs: No Transportation Needs (10/30/2022)  Utilities: Not At Risk (10/30/2022)  Financial Resource Strain: Medium Risk (03/17/2022)   [facility] from [facility] visits prior to [facility]/21/2022[facility] visits prior to 06/21/2022.  Physical Activity: Inactive (03/17/2022)   [facility] from [facility] prior to 06/21/2022., [facility] visits prior to 06/21/2022.  Social Connections: Socially Isolated (03/17/2022)   [facility] from [facility] visits prior to 06/21/2022., [facility] visits prior to 06/21/2022.  Stress: No Stress Concern Present (03/17/2022)   [facility]  visits prior to 06/21/2022., [facility] visits prior to 06/21/2022.  Tobacco Use: Low Risk  (10/29/2022)  Recent Concern: Tobacco Use - Medium Risk (10/24/2022)   [facility]    Readmission Risk Interventions    11/05/2022   12:14 PM 10/31/2022    4:52 PM  Readmission Risk Prevention Plan  Transportation Screening Complete Complete  PCP or Specialist Appt within 5-7 Days Complete Complete  Home Care Screening Complete Complete  Medication Review (RN CM) Complete Complete

## 2022-11-07 NOTE — Progress Notes (Signed)
[facility]                                                                              Sheryl Aguilar, is a 77 y.o. female, DOB - [birth year], ZOX:[id number] Admit date - 10/29/2022    Outpatient Primary MD for the patient is Andreas Blower., MD  LOS - 8  days  Chief Complaint  Patient presents with   Shortness of Breath       Brief summary    Sheryl Aguilar is a 77 y.o. female with medical history significant for severe morbid obesity with BMI of 56, COPD, OSA on CPAP, chronic hypoxia on 3 L nasal cannula continuously, chronic HFrEF, permanent atrial fibrillation on eliquis, recently discharged from outside facility 10/28/22 after 6 weeks [facility].  She was treated for pneumonia.  Her hospital stay was complicated by GI bleed, for which she received blood transfusion and had an EGD.  Was also complicated by HCAP and fluid overload she was treated for HCAP and she had a thoracentesis.  The patient was discharged home on 7/9, due to payment issues for SNF placement.  At home, she was unable to get from bed to commode and remained extremely weak.  She ended up back at [facility].   She was diagnosed with CAUTI.  Foley catheter was removed and patient was started on Rocephin.  PT evaluated patient and recommended for SNF placement.  Assessment & Plan     Klebsiella pneumonia CAUTI, present on admission -Foley catheter was removed -Urine culture showing Klebsiella pneumonia, sensitive to cefazolin -Completed cefazolin on 7/17 - no acute issues, awaiting SNF    Generalized weakness and debility -Following significant hospital stay greater than 30 days at an outside facility -PT eval recommending SNF - increase mobility, OOB to chair    CKD stage 3B -Baseline creatinine 1.4 -Creatinine currently stable at baseline, creatinine 1.1   Diabetes mellitus type 2, well-controlled -A1c 6.1 CBG (last 3)  Recent Labs     11/06/22 2031 11/07/22 0842 11/07/22 1243  GLUCAP 158* 127* 112*   -CBG stable, continue SSI   A-fib -Rate controlled, continue Coreg -Continue Eliquis   Hyperlipidemia -Continue Lipitor   Chronic systolic CHF -Without exacerbation, continue heart healthy diet with fluid restriction -cont lasix 40mg  BID  Chronic hypoxemic respiratory failure -Uses 3 L nasal cannula at baseline   COPD -Currently no wheezing, stable.    OSA/OHS  -BiPAP nightly, encouraged compliance   Morbid obesity -Body mass index is 39.01 kg/m.   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 Follow up ultrasound is recommended   Obesity Estimated body mass index is 39.01 kg/m as calculated from the following:   Height as of this encounter: 5\' 4"  (1.626 m).   Weight as of this encounter: 103.1 kg.  Code Status: DNR DVT Prophylaxis:   apixaban (ELIQUIS) tablet 5 mg   Level of Care: Level of care: Telemetry Medical Family Communication:  Disposition Plan:      Remains inpatient appropriate: Awaiting SNF     Antimicrobials:   Anti-infectives (From admission, onward)    Start     Dose/Rate Route Frequency Ordered Stop   11/01/22 2000  ceFAZolin (ANCEF) IVPB 1 g/50 mL premix        1 g 100 mL/hr over 30 Minutes Intravenous Every 8 hours 11/01/22 1045 11/05/22 2053   10/30/22  2200  cefTRIAXone (ROCEPHIN) 2 g in sodium chloride 0.9 % 100 mL IVPB  Status:  Discontinued        2 g 200 mL/hr over 30 Minutes Intravenous Every 24 hours 10/30/22 0333 11/01/22 1029   10/30/22 0045  cefTRIAXone (ROCEPHIN) 2 g in sodium chloride 0.9 % 100 mL IVPB        2 g 200 mL/hr over 30 Minutes Intravenous  Once 10/30/22 0039 10/30/22 0230          Medications  apixaban  5 mg Oral BID   atorvastatin  10 mg Oral Daily   carvedilol  12.5 mg Oral BID WC   DULoxetine  60 mg Oral Daily   furosemide  20 mg Oral BID   insulin aspart  0-20 Units Subcutaneous TID WC   insulin aspart  0-5 Units Subcutaneous QHS   mirtazapine  7.5 mg Oral QHS      Subjective:   Sheryl Aguilar was seen and examined today.  No acute issues overnight. No fevers, chest pain or shortness of breath.    Vitals:   11/07/22 0418 11/07/22 0920 11/07/22 1015 11/07/22 1248  BP: (!) 159/61 (!) 124/91 115/71 122/87  Pulse: 86 91 90 91  Resp: 18 18  18   Temp: 98.5 F (36.9 C) 97.8 F (36.6 C)  98.6 F (37 C)  TempSrc:  Oral    SpO2: 96% 98%  95%  Weight:      Height:        Intake/Output Summary (Last 24 hours) at 11/07/2022 1317 Last data filed at 11/07/2022 1015 Gross per 24 hour  Intake 168 ml  Output 1150 ml  Net -982 ml     Wt Readings from Last 3 Encounters:  11/06/22 103.1 kg  01/15/22 (!) 138 kg  01/24/21 (!) 138.3 kg    Physical Exam General: Alert and oriented x 3, NAD Cardiovascular: S1 S2 clear, RRR.  Respiratory: CTAB Gastrointestinal: Soft, nontender, nondistended, NBS Ext: no pedal edema bilaterally Neuro: no new deficits Psych: Normal affect    Data Reviewed:  I have personally reviewed following labs    CBC Lab Results  Component Value Date   WBC 3.7 (L) 11/05/2022   RBC 2.72 (L) 11/05/2022   HGB 8.2 (L) 11/05/2022   HCT 27.9 (L) 11/05/2022   MCV 102.6 (H) 11/05/2022   MCH 30.1 11/05/2022   PLT 189 11/05/2022   MCHC 29.4 (L) 11/05/2022   RDW 19.2 (H)  11/05/2022   LYMPHSABS 0.9 10/29/2022   MONOABS 0.4 10/29/2022   EOSABS 0.2 10/29/2022   BASOSABS 0.0 10/29/2022     Last metabolic panel Lab Results  Component Value Date  NA 136 11/05/2022   K 4.3 11/05/2022   CL 98 11/05/2022   CO2 33 (H) 11/05/2022   BUN 28 (H) 11/05/2022   CREATININE 1.19 (H) 11/05/2022   GLUCOSE 128 (H) 11/05/2022   GFRNONAA 47 (L) 11/05/2022   GFRAA 57 (L) 12/07/2017   CALCIUM 8.4 (L) 11/05/2022   PHOS 4.3 10/30/2022   PROT 7.5 01/15/2022   ALBUMIN 3.5 01/15/2022   BILITOT 1.7 (H) 01/15/2022   ALKPHOS 70 01/15/2022   AST 21 01/15/2022   ALT 19 01/15/2022   ANIONGAP 5 11/05/2022    CBG (last 3)  Recent Labs    11/06/22 2031 11/07/22 0842 11/07/22 1243  GLUCAP 158* 127* 112*      Coagulation Profile: No results for input(s): "INR", "PROTIME" in the last 168 hours.   Radiology Studies: I have personally reviewed the imaging studies  No results found.     Thad Ranger M.D. [facility] 11/07/2022, 1:17 PM  Available via Epic secure chat 7am-7pm After 7 pm, please refer to night coverage provider listed on amion.

## 2022-11-08 ENCOUNTER — Inpatient Hospital Stay (HOSPITAL_COMMUNITY): Payer: 59

## 2022-11-08 DIAGNOSIS — N390 Urinary tract infection, site not specified: Secondary | ICD-10-CM | POA: Diagnosis not present

## 2022-11-08 DIAGNOSIS — J810 Acute pulmonary edema: Secondary | ICD-10-CM | POA: Diagnosis not present

## 2022-11-08 DIAGNOSIS — R319 Hematuria, unspecified: Secondary | ICD-10-CM | POA: Diagnosis not present

## 2022-11-08 DIAGNOSIS — R0602 Shortness of breath: Secondary | ICD-10-CM | POA: Diagnosis not present

## 2022-11-08 LAB — BASIC METABOLIC PANEL
Anion gap: 6 (ref 5–15)
BUN: 22 mg/dL (ref 8–23)
CO2: 34 mmol/L — ABNORMAL HIGH (ref 22–32)
Calcium: 9.1 mg/dL (ref 8.9–10.3)
Chloride: 99 mmol/L (ref 98–111)
Creatinine, Ser: 1.12 mg/dL — ABNORMAL HIGH (ref 0.44–1.00)
GFR, Estimated: 51 mL/min — ABNORMAL LOW (ref >60)
Glucose, Bld: 113 mg/dL — ABNORMAL HIGH (ref 70–99)
Potassium: 4.9 mmol/L (ref 3.5–5.1)
Sodium: 139 mmol/L (ref 135–145)

## 2022-11-08 LAB — GLUCOSE, CAPILLARY
Glucose-Capillary: 120 mg/dL — ABNORMAL HIGH (ref 70–99)
Glucose-Capillary: 156 mg/dL — ABNORMAL HIGH (ref 70–99)
Glucose-Capillary: 116 mg/dL — ABNORMAL HIGH (ref 70–99)
Glucose-Capillary: 126 mg/dL — ABNORMAL HIGH (ref 70–99)

## 2022-11-08 LAB — CBC
HCT: 30.2 % — ABNORMAL LOW (ref 36.0–46.0)
Hemoglobin: 8.8 g/dL — ABNORMAL LOW (ref 12.0–15.0)
MCH: 30.2 pg (ref 26.0–34.0)
MCHC: 29.1 g/dL — ABNORMAL LOW (ref 30.0–36.0)
MCV: 103.8 fL — ABNORMAL HIGH (ref 80.0–100.0)
Platelets: 178 10*3/uL (ref 150–400)
RBC: 2.91 MIL/uL — ABNORMAL LOW (ref 3.87–5.11)
RDW: 18.9 % — ABNORMAL HIGH (ref 11.5–15.5)
WBC: 2.7 10*3/uL — ABNORMAL LOW (ref 4.0–10.5)
nRBC: 0 % (ref 0.0–0.2)

## 2022-11-08 MED ORDER — IOHEXOL 9 MG/ML PO SOLN
500.0000 mL | ORAL | Status: AC
  Administered 2022-11-08 (×2): 500 mL via ORAL

## 2022-11-08 NOTE — Progress Notes (Signed)
[facility]                                                                              Sheryl Aguilar, is a 77 y.o. female, DOB - 1945-07-17, VHQ:[id number] Admit date - 10/29/2022    Outpatient Primary MD for the patient is Andreas Blower., MD  LOS - 9  days  Chief Complaint  Patient presents with   Shortness of Breath       Brief summary    Sheryl Aguilar is a 77 y.o. female with medical history significant for severe morbid obesity with BMI of 56, COPD, OSA on CPAP, chronic hypoxia on 3 L nasal cannula continuously, chronic HFrEF, permanent atrial fibrillation on eliquis, recently discharged from outside facility 10/28/22 after 6 weeks hospital stay at [facility].  She was treated for pneumonia.  Her hospital stay was complicated by GI bleed, for which she received blood transfusion and had an EGD.  Was also complicated by HCAP and fluid overload she was treated for HCAP and she had a thoracentesis.  The patient was discharged home on 7/9, due to payment issues for SNF placement.  At home, she was unable to get from bed to commode and remained extremely weak.  She ended up back at [facility].   She was diagnosed with CAUTI.  Foley catheter was removed and patient was started on Rocephin.  PT evaluated patient and recommended for SNF placement.  Assessment & Plan     Klebsiella pneumonia CAUTI, present on admission -Foley catheter was removed -Urine culture showing Klebsiella pneumonia, sensitive to cefazolin -Completed cefazolin on 7/17 - awaiting SNF    Generalized weakness and debility -Following significant hospital stay greater than 30 days at an outside facility -PT eval recommending SNF -c/o abdominal pain,b/l upper quadrants, will obtain CT abdomen.  No nausea vomiting, fevers   CKD stage 3B -Baseline creatinine 1.4 -Creatinine stable, at baseline, 1.1   Diabetes mellitus type 2, well-controlled -A1c 6.1 CBG (last 3)   Recent Labs    11/07/22 2058 11/08/22 0753 11/08/22 1152  GLUCAP 102* 120* 156*   -CBG stable, continue SSI   A-fib -Rate controlled, continue Coreg -Continue Eliquis   Hyperlipidemia -Continue Lipitor   Chronic systolic CHF -Without exacerbation, continue heart healthy diet with fluid restriction -cont lasix 40mg  BID  Chronic hypoxemic respiratory failure -Uses 3 L nasal cannula at baseline   COPD -Currently no wheezing, stable.    OSA/OHS  -BiPAP nightly, encouraged compliance   Morbid obesity -Body mass index is 39.01 kg/m.   Recent hospitalization for GI bleed Macrocytic anemia -S/p EGD at [facility] 7/1: blood streaks in the oropharynx and a duodenal polyp which was biopsied  -S/p colonoscopy 7/5: irritation and oozing at the site of ileocolonic anastamosis s/p hemoclips as well as sub-centimeter descending colon polyps s/p polypectomy and hemoclip placement  -S/p laryngoscopy: normal laryngeal exam with minimal left nasal septal excoriation unlikely to cause significant drop in hemoglobin -H&H stable -B12 369, folate 6.2   Incidental finding need outpatient follow up -Complex cystic lesion w/ calcifications on right side of thyroid  gland measuring 2.7x1.7 cm. Follow up ultrasound is recommended   Obesity Estimated body mass index is 39.01 kg/m as calculated from the following:   Height as of this encounter: 5\' 4"  (1.626 m).   Weight as of this encounter: 103.1 kg.  Code Status: DNR DVT Prophylaxis:   apixaban (ELIQUIS) tablet 5 mg   Level of Care: Level of care: Telemetry Medical Family Communication:  Disposition Plan:      Remains inpatient appropriate: Awaiting SNF     Antimicrobials:   Anti-infectives (From admission, onward)    Start     Dose/Rate Route Frequency Ordered Stop   11/01/22 2000  ceFAZolin (ANCEF) IVPB 1 g/50 mL premix        1 g 100 mL/hr over 30 Minutes Intravenous Every 8 hours 11/01/22 1045 11/05/22 2053    10/30/22 2200  cefTRIAXone (ROCEPHIN) 2 g in sodium chloride 0.9 % 100 mL IVPB  Status:  Discontinued        2 g 200 mL/hr over 30 Minutes Intravenous Every 24 hours 10/30/22 0333 11/01/22 1029   10/30/22 0045  cefTRIAXone (ROCEPHIN) 2 g in sodium chloride 0.9 % 100 mL IVPB        2 g 200 mL/hr over 30 Minutes Intravenous  Once 10/30/22 0039 10/30/22 0230          Medications  apixaban  5 mg Oral BID   atorvastatin  10 mg Oral Daily   carvedilol  12.5 mg Oral BID WC   DULoxetine  60 mg Oral Daily   furosemide  20 mg Oral BID   insulin aspart  0-20 Units Subcutaneous TID WC   insulin aspart  0-5 Units Subcutaneous QHS   iohexol  500 mL Oral Q1H   mirtazapine  7.5 mg Oral QHS      Subjective:   Sheryl Aguilar was seen and examined today.  Complaining of upper abdominal pain below breast bilaterally.  No fevers, nausea vomiting or any diarrhea.    Vitals:   11/07/22 2018 11/07/22 2300 11/08/22 0444 11/08/22 0748  BP: 121/70  115/87 133/86  Pulse: 82  85 (!) 102  Resp: 19  19 18   Temp: 98.1 F (36.7 C)  98 F (36.7 C) 98 F (36.7 C)  TempSrc: Oral  Oral Oral  SpO2: 100% 100% 98% 96%  Weight:      Height:        Intake/Output Summary (Last 24 hours) at 11/08/2022 1311 Last data filed at 11/08/2022 0600 Gross per 24 hour  Intake 600 ml  Output 800 ml  Net -200 ml     Wt Readings from Last 3 Encounters:  11/06/22 103.1 kg  01/15/22 (!) 138 kg  01/24/21 (!) 138.3 kg   Physical Exam General: Alert and oriented x 3, NAD Cardiovascular: S1 S2 clear, RRR.  Respiratory: CTAB, no wheezing Gastrointestinal: Soft, mild TTP upper abdomen, nondistended, NBS Ext: no pedal edema bilaterally Neuro: no new deficits Psych: Normal affect    Data Reviewed:  I have personally reviewed following labs    CBC Lab Results  Component Value Date   WBC 2.7 (L) 11/08/2022   RBC 2.91 (L) 11/08/2022   HGB 8.8 (L) 11/08/2022   HCT 30.2 (L) 11/08/2022   MCV 103.8 (H)  11/08/2022   MCH 30.2 11/08/2022   PLT 178 11/08/2022   MCHC 29.1 (L) 11/08/2022   RDW 18.9 (H) 11/08/2022   LYMPHSABS 0.9 10/29/2022   MONOABS 0.4 10/29/2022   EOSABS 0.2 10/29/2022  BASOSABS 0.0 10/29/2022     Last metabolic panel Lab Results  Component Value Date   NA 139 11/08/2022   K 4.9 11/08/2022   CL 99 11/08/2022   CO2 34 (H) 11/08/2022   BUN 22 11/08/2022   CREATININE 1.12 (H) 11/08/2022   GLUCOSE 113 (H) 11/08/2022   GFRNONAA 51 (L) 11/08/2022   GFRAA 57 (L) 12/07/2017   CALCIUM 9.1 11/08/2022   PHOS 4.3 10/30/2022   PROT 7.5 01/15/2022   ALBUMIN 3.5 01/15/2022   BILITOT 1.7 (H) 01/15/2022   ALKPHOS 70 01/15/2022   AST 21 01/15/2022   ALT 19 01/15/2022   ANIONGAP 6 11/08/2022    CBG (last 3)  Recent Labs    11/07/22 2058 11/08/22 0753 11/08/22 1152  GLUCAP 102* 120* 156*      Coagulation Profile: No results for input(s): "INR", "PROTIME" in the last 168 hours.   Radiology Studies: I have personally reviewed the imaging studies  No results found.     Thad Ranger M.D. [facility] 11/08/2022, 1:11 PM  Available via Epic secure chat 7am-7pm After 7 pm, please refer to night coverage provider listed on amion.

## 2022-11-08 NOTE — Progress Notes (Signed)
Patient was taken for the CT via bed at 1800 PM.

## 2022-11-08 NOTE — Plan of Care (Signed)

## 2022-11-09 DIAGNOSIS — R319 Hematuria, unspecified: Secondary | ICD-10-CM | POA: Diagnosis not present

## 2022-11-09 DIAGNOSIS — N390 Urinary tract infection, site not specified: Secondary | ICD-10-CM | POA: Diagnosis not present

## 2022-11-09 DIAGNOSIS — R0602 Shortness of breath: Secondary | ICD-10-CM | POA: Diagnosis not present

## 2022-11-09 DIAGNOSIS — J810 Acute pulmonary edema: Secondary | ICD-10-CM | POA: Diagnosis not present

## 2022-11-09 LAB — GLUCOSE, CAPILLARY
Glucose-Capillary: 122 mg/dL — ABNORMAL HIGH (ref 70–99)
Glucose-Capillary: 134 mg/dL — ABNORMAL HIGH (ref 70–99)
Glucose-Capillary: 109 mg/dL — ABNORMAL HIGH (ref 70–99)
Glucose-Capillary: 138 mg/dL — ABNORMAL HIGH (ref 70–99)

## 2022-11-09 NOTE — Plan of Care (Signed)

## 2022-11-09 NOTE — Progress Notes (Signed)
[facility]                                                                              Sheryl Aguilar, is a 77 y.o. female, DOB - 1946-01-01, ZOX:[id number] Admit date - 10/29/2022    Outpatient Primary MD for the patient is Andreas Blower., MD  LOS - 10  days  Chief Complaint  Patient presents with   Shortness of Breath       Brief summary    Sheryl Aguilar is a 77 y.o. female with medical history significant for severe morbid obesity with BMI of 56, COPD, OSA on CPAP, chronic hypoxia on 3 L nasal cannula continuously, chronic HFrEF, permanent atrial fibrillation on eliquis, recently discharged from outside facility 10/28/22 after 6 weeks hospital stay at [facility].  She was treated for pneumonia.  Her hospital stay was complicated by GI bleed, for which she received blood transfusion and had an EGD.  Was also complicated by HCAP and fluid overload she was treated for HCAP and she had a thoracentesis.  The patient was discharged home on 7/9, due to payment issues for SNF placement.  At home, she was unable to get from bed to commode and remained extremely weak.  She ended up back at [facility].   She was diagnosed with CAUTI.  Foley catheter was removed and patient was started on Rocephin.  PT evaluated patient and recommended for SNF placement.  Assessment & Plan     Klebsiella pneumonia CAUTI, present on admission -Foley catheter was removed -Urine culture showing Klebsiella pneumonia, sensitive to cefazolin -Completed cefazolin on 7/17 - awaiting SNF    Generalized weakness and debility -Following significant hospital stay greater than 30 days at an outside facility -PT eval recommending SNF -CT abdomen pelvis negative for any acute abdominal pathology.  Today not complaining of any abdominal pain, feels better.   CKD stage 3B -Baseline creatinine 1.4 -Creatinine stable, at baseline, 1.1   Diabetes mellitus type 2,  well-controlled -A1c 6.1 CBG (last 3)  Recent Labs    11/08/22 1608 11/08/22 2002 11/09/22 0808  GLUCAP 116* 126* 122*   -CBG stable, continue sliding scale insulin   A-fib -Rate controlled, continue Coreg -Continue Eliquis   Hyperlipidemia -Continue Lipitor   Chronic systolic CHF -Without exacerbation, continue heart healthy diet with fluid restriction -cont lasix 40mg  BID  Chronic hypoxemic respiratory failure -Uses 3 L nasal cannula at baseline   COPD -Currently no wheezing, stable.    OSA/OHS  -BiPAP nightly, encouraged compliance   Morbid obesity -Body mass index is 39.01 kg/m.   Recent hospitalization for GI bleed Macrocytic anemia -S/p EGD at [facility] 7/1: blood streaks in the oropharynx and a duodenal polyp which was biopsied  -S/p colonoscopy 7/5: irritation and oozing at the site of ileocolonic anastamosis s/p hemoclips as well as sub-centimeter descending colon polyps s/p polypectomy and hemoclip placement  -S/p laryngoscopy: normal laryngeal exam with minimal left nasal septal excoriation unlikely to cause significant drop in hemoglobin -H&H stable -B12 369, folate 6.2   Incidental finding need outpatient follow up -Complex cystic lesion  w/ calcifications on right side of thyroid gland measuring 2.7x1.7 cm. Follow up ultrasound is recommended   Obesity Estimated body mass index is 49.27 kg/m as calculated from the following:   Height as of this encounter: 5\' 4"  (1.626 m).   Weight as of this encounter: 130.2 kg.  Code Status: DNR DVT Prophylaxis:   apixaban (ELIQUIS) tablet 5 mg   Level of Care: Level of care: Telemetry Medical Family Communication:  Disposition Plan:      Remains inpatient appropriate: Awaiting SNF     Antimicrobials:   Anti-infectives (From admission, onward)    Start     Dose/Rate Route Frequency Ordered Stop   11/01/22 2000  ceFAZolin (ANCEF) IVPB 1 g/50 mL premix        1 g 100 mL/hr over 30  Minutes Intravenous Every 8 hours 11/01/22 1045 11/05/22 2053   10/30/22 2200  cefTRIAXone (ROCEPHIN) 2 g in sodium chloride 0.9 % 100 mL IVPB  Status:  Discontinued        2 g 200 mL/hr over 30 Minutes Intravenous Every 24 hours 10/30/22 0333 11/01/22 1029   10/30/22 0045  cefTRIAXone (ROCEPHIN) 2 g in sodium chloride 0.9 % 100 mL IVPB        2 g 200 mL/hr over 30 Minutes Intravenous  Once 10/30/22 0039 10/30/22 0230          Medications  apixaban  5 mg Oral BID   atorvastatin  10 mg Oral Daily   carvedilol  12.5 mg Oral BID WC   DULoxetine  60 mg Oral Daily   furosemide  20 mg Oral BID   insulin aspart  0-20 Units Subcutaneous TID WC   insulin aspart  0-5 Units Subcutaneous QHS   mirtazapine  7.5 mg Oral QHS      Subjective:   Sheryl Aguilar was seen and examined today.  No acute issues today.  No nausea vomiting or abdominal pain today.     Vitals:   11/08/22 2000 11/09/22 0558 11/09/22 0703 11/09/22 0746  BP: 117/60 107/79  122/85  Pulse: 89 92  94  Resp: 18 14  17   Temp: 98 F (36.7 C) 98 F (36.7 C)  98.2 F (36.8 C)  TempSrc: Oral Oral  Oral  SpO2: 96% 98%  100%  Weight:   130.2 kg   Height:        Intake/Output Summary (Last 24 hours) at 11/09/2022 1203 Last data filed at 11/09/2022 0944 Gross per 24 hour  Intake 236 ml  Output 350 ml  Net -114 ml     Wt Readings from Last 3 Encounters:  11/09/22 130.2 kg  01/15/22 (!) 138 kg  01/24/21 (!) 138.3 kg    Physical Exam General: Alert and oriented x 3, NAD Cardiovascular: S1 S2 clear, RRR.  Respiratory: CTAB, no wheezing Gastrointestinal: Soft, nontender, nondistended, NBS Ext: no pedal edema bilaterally Neuro: no new deficits Psych: Normal affect   Data Reviewed:  I have personally reviewed following labs    CBC Lab Results  Component Value Date   WBC 2.7 (L) 11/08/2022   RBC 2.91 (L) 11/08/2022   HGB 8.8 (L) 11/08/2022   HCT 30.2 (L) 11/08/2022   MCV 103.8 (H) 11/08/2022   MCH 30.2  11/08/2022   PLT 178 11/08/2022   MCHC 29.1 (L) 11/08/2022   RDW 18.9 (H) 11/08/2022   LYMPHSABS 0.9 10/29/2022   MONOABS 0.4 10/29/2022   EOSABS 0.2 10/29/2022   BASOSABS 0.0 10/29/2022  Last metabolic panel Lab Results  Component Value Date   NA 139 11/08/2022   K 4.9 11/08/2022   CL 99 11/08/2022   CO2 34 (H) 11/08/2022   BUN 22 11/08/2022   CREATININE 1.12 (H) 11/08/2022   GLUCOSE 113 (H) 11/08/2022   GFRNONAA 51 (L) 11/08/2022   GFRAA 57 (L) 12/07/2017   CALCIUM 9.1 11/08/2022   PHOS 4.3 10/30/2022   PROT 7.5 01/15/2022   ALBUMIN 3.5 01/15/2022   BILITOT 1.7 (H) 01/15/2022   ALKPHOS 70 01/15/2022   AST 21 01/15/2022   ALT 19 01/15/2022   ANIONGAP 6 11/08/2022    CBG (last 3)  Recent Labs    11/08/22 1608 11/08/22 2002 11/09/22 0808  GLUCAP 116* 126* 122*      Coagulation Profile: No results for input(s): "INR", "PROTIME" in the last 168 hours.   Radiology Studies: I have personally reviewed the imaging studies  CT ABDOMEN PELVIS WO CONTRAST  Result Date: 11/08/2022 CLINICAL DATA:  Abdominal pain, acute, nonlocalized EXAM: CT ABDOMEN AND PELVIS WITHOUT CONTRAST TECHNIQUE: Multidetector CT imaging of the abdomen and pelvis was performed following the standard protocol without IV contrast. RADIATION DOSE REDUCTION: This exam was performed according to the departmental dose-optimization program which includes automated exposure control, adjustment of the mA and/or kV according to patient size and/or use of iterative reconstruction technique. COMPARISON:  07/26/2022 FINDINGS: Lower chest: small right pleural effusion and trace left pleural effusion. Heart is enlarged. Coronary artery and aortic atherosclerosis. Hepatobiliary: Few scattered small hypodensities most compatible with cysts. No suspicious focal hepatic abnormality. Prior cholecystectomy. Common bile duct remains mildly prominent. Pancreas: No focal abnormality or ductal dilatation. 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 Spleen: Spleen is mildly enlarged, 13.3 cm in craniocaudal length. 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 Adrenals/Urinary Tract: No adrenal abnormality. No focal renal abnormality. No stones or hydronephrosis. Urinary bladder is unremarkable. Stomach/Bowel: Sigmoid diverticulosis. No active diverticulitis. Stomach and small bowel decompressed, unremarkable. Vascular/Lymphatic: Aortic atherosclerosis. No evidence of aneurysm or adenopathy. Reproductive: Uterus and adnexa unremarkable.  No mass. Other: No free fluid or free air. Musculoskeletal: No acute bony abnormality. IMPRESSION: Small right pleural effusion and trace left pleural effusion. Bibasilar atelectasis. Mild splenomegaly. 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 Sigmoid diverticulosis.  No active diverticulitis. Coronary artery disease, aortic atherosclerosis. Electronically Signed   By: Charlett Nose M.D.   On: 11/08/2022 21:19       Klaryssa Fauth M.D. [facility] 11/09/2022, 12:03 PM  Available via Epic secure chat 7am-7pm After 7 pm, please refer to night coverage provider listed on amion.

## 2022-11-10 DIAGNOSIS — J810 Acute pulmonary edema: Secondary | ICD-10-CM | POA: Diagnosis not present

## 2022-11-10 DIAGNOSIS — N390 Urinary tract infection, site not specified: Secondary | ICD-10-CM | POA: Diagnosis not present

## 2022-11-10 DIAGNOSIS — R0602 Shortness of breath: Secondary | ICD-10-CM | POA: Diagnosis not present

## 2022-11-10 DIAGNOSIS — R319 Hematuria, unspecified: Secondary | ICD-10-CM | POA: Diagnosis not present

## 2022-11-10 LAB — GLUCOSE, CAPILLARY
Glucose-Capillary: 121 mg/dL — ABNORMAL HIGH (ref 70–99)
Glucose-Capillary: 146 mg/dL — ABNORMAL HIGH (ref 70–99)
Glucose-Capillary: 148 mg/dL — ABNORMAL HIGH (ref 70–99)
Glucose-Capillary: 121 mg/dL — ABNORMAL HIGH (ref 70–99)

## 2022-11-10 MED ORDER — IPRATROPIUM-ALBUTEROL 0.5-2.5 (3) MG/3ML IN SOLN: 3.0000 mL | RESPIRATORY_TRACT | Status: AC | PRN

## 2022-11-10 MED ORDER — POLYETHYLENE GLYCOL 3350 17 G PO PACK: 17.0000 g | PACK | Freq: Every day | ORAL | Status: AC | PRN

## 2022-11-10 MED ORDER — NYSTATIN 100000 UNIT/GM EX POWD: Freq: Three times a day (TID) | CUTANEOUS | Status: AC | PRN

## 2022-11-10 MED ORDER — GERHARDT'S BUTT CREAM: 1.0000 | TOPICAL_CREAM | Freq: Three times a day (TID) | CUTANEOUS | Status: AC | PRN

## 2022-11-10 MED ORDER — ATORVASTATIN CALCIUM 10 MG PO TABS: 10.0000 mg | ORAL_TABLET | Freq: Every day | ORAL | Status: AC

## 2022-11-10 MED ORDER — ORAL CARE MOUTH RINSE: 15.0000 mL | OROMUCOSAL | Status: DC | PRN

## 2022-11-10 MED ORDER — CARVEDILOL 12.5 MG PO TABS: 12.5000 mg | ORAL_TABLET | Freq: Two times a day (BID) | ORAL | Status: AC

## 2022-11-10 NOTE — Discharge Summary (Signed)
Physician Discharge Summary   Patient: Sheryl Aguilar MRN: [id number] DOB: 1946/04/20  Admit date:     10/29/2022  Discharge date: 11/10/22  Discharge Physician: Sheryl Ranger, MD    PCP: Sheryl Blower., MD   Recommendations at discharge:   Continue PT OT, fall precautions Incidental finding need outpatient follow up-Complex cystic lesion w/ calcifications on right side of thyroid gland measuring 2.7x1.7 cm. Follow up ultrasound is recommended O2 2 to 3 L at baseline  Discharge Diagnoses:  Klebsiella pneumonia UTI (urinary tract infection)   Chronic respiratory failure with hypoxia (HCC)   A-fib (HCC)   Generalized weakness   CKD stage 3b, GFR 30-44 ml/min (HCC)   Controlled type 2 diabetes mellitus without complication, without long-term current use of insulin (HCC)   Hyperlipidemia   Chronic systolic CHF (congestive heart failure) (HCC)   COPD (chronic obstructive pulmonary disease) (HCC)   OSA (obstructive sleep apnea)   Obesity hypoventilation syndrome (HCC)   Morbid obesity with BMI of 50.0-59.9, adult [facility])    Hospital Course:  Sheryl Aguilar is a 77 y.o. female with medical history significant for severe morbid obesity with BMI of 56, COPD, OSA on CPAP, chronic hypoxia on 3 L nasal cannula continuously, chronic HFrEF, permanent atrial fibrillation on eliquis, recently discharged from outside facility 10/28/22 after 6 weeks hospital stay at [facility].  She was treated for pneumonia.  Her hospital stay was complicated by GI bleed, for which she received blood transfusion and had an EGD.  Was also complicated by HCAP and fluid overload she was treated for HCAP and she had a thoracentesis.  The patient was discharged home on 7/9, due to payment issues for SNF placement.  At home, she was unable to get from bed to commode and remained extremely weak.  She ended up back at [facility].   She was diagnosed with CAUTI.  Foley catheter was removed and  patient was started on Rocephin.  PT evaluated patient and recommended for SNF placement.   Assessment and Plan:   Klebsiella pneumonia CAUTI, present on admission -Foley catheter was removed -Urine culture showing Klebsiella pneumonia, sensitive to cefazolin -Completed cefazolin on 7/17   Generalized weakness and debility -Following significant hospital stay greater than 30 days at an outside facility -PT eval recommending SNF -CT abdomen pelvis negative for any acute abdominal pathology.  Tolerating diet without any difficulty, no nausea vomiting or abdominal pain.   CKD stage 3B -Baseline creatinine 1.4 -Creatinine stable, at baseline, 1.1   Diabetes mellitus type 2, well-controlled -Hemoglobin A1c 6.1 -Continue outpatient regimen.  Patient was placed on sliding scale insulin while inpatient.   A-fib -Rate controlled, continue Coreg -Continue Eliquis   Hyperlipidemia -Continue Lipitor   Chronic systolic CHF -Without exacerbation, continue heart healthy diet with fluid restriction -Continue Lasix at outpatient dose 20 mg twice daily   Chronic hypoxemic respiratory failure -Uses 3 L nasal cannula at baseline   COPD -Currently no wheezing, stable.    OSA/OHS  -CPAP nightly, encouraged compliance   Morbid obesity -Body mass index is 39.01 kg/m.   Recent hospitalization for GI bleed Macrocytic anemia -S/p EGD at [facility] 7/1: blood streaks in the oropharynx and a duodenal polyp which was biopsied  -S/p colonoscopy 7/5: irritation and oozing at the site of ileocolonic anastamosis s/p hemoclips as well as sub-centimeter descending colon polyps s/p polypectomy and hemoclip placement  -S/p laryngoscopy: normal laryngeal exam with minimal left nasal septal excoriation unlikely  to cause significant drop in hemoglobin -H&H stable, hemoglobin 8.8 at discharge -B12 369, folate 6.2   Incidental finding need outpatient follow up -Complex cystic lesion w/  calcifications on right side of thyroid gland measuring 2.7x1.7 cm. Follow up ultrasound is recommended   Obesity Estimated body mass index is 49.27 kg/m as calculated from the following:   Height as of this encounter: 5\' 4"  (1.626 m).   Weight as of this encounter: 130.2 kg.       Pain control - [employer] Controlled Substance Reporting System database was reviewed. and patient was instructed, not to drive, operate heavy machinery, perform activities at heights, swimming or participation in water activities or provide baby-sitting services while on Pain, Sleep and Anxiety Medications; until their outpatient Physician has advised to do so again. Also recommended to not to take more than prescribed Pain, Sleep and Anxiety Medications.  Consultants:  Procedures performed:   Disposition: Skilled nursing facility Diet recommendation:  Discharge Diet Orders (From admission, onward)     Start     Ordered   11/10/22 0000  Carb modified diet   11/10/22 1155            DISCHARGE MEDICATION: Allergies as of 11/10/2022       Reactions   Filgrastim Swelling, Shortness Of Breath   Hypotension.   Sargramostim Shortness Of Breath        Medication List     STOP taking these medications    gabapentin 300 MG capsule Commonly known as: NEURONTIN   loperamide 2 MG capsule Commonly known as: IMODIUM       TAKE these medications    apixaban 5 MG Tabs tablet Commonly known as: ELIQUIS Take 5 mg by mouth 2 (two) times daily.   atorvastatin 10 MG tablet Commonly known as: LIPITOR Take 1 tablet (10 mg total) by mouth daily. Start taking on: November 11, 2022   carvedilol 12.5 MG tablet Commonly known as: COREG Take 1 tablet (12.5 mg total) by mouth 2 (two) times daily with a meal. What changed:  medication strength how much to take   cholecalciferol 25 MCG (1000 UNIT) tablet Commonly known as: VITAMIN D3 Take 1,000 Units by mouth daily.   DULoxetine 60 MG  capsule Commonly known as: CYMBALTA Take 60 mg by mouth daily.   furosemide 20 MG tablet Commonly known as: LASIX Take 20 mg by mouth 2 (two) times daily.   Gerhardt's butt cream Crea Apply 1 Application topically 3 (three) times daily as needed for irritation.   ipratropium-albuterol 0.5-2.5 (3) MG/3ML Soln Commonly known as: DUONEB Take 3 mLs by nebulization every 4 (four) hours as needed.   metFORMIN 1000 MG tablet Commonly known as: GLUCOPHAGE Take 1,000 mg by mouth 2 (two) times daily with a meal.   mirtazapine 7.5 MG tablet Commonly known as: REMERON Take 7.5 mg by mouth at bedtime.   nystatin powder Commonly known as: MYCOSTATIN/NYSTOP Apply topically 3 (three) times daily as needed (Skin fold irritation).   oxyCODONE-acetaminophen 10-325 MG tablet Commonly known as: PERCOCET Take 1 tablet by mouth every 4 (four) hours as needed for up to 5 days for pain.   polyethylene glycol 17 g packet Commonly known as: MIRALAX / GLYCOLAX Take 17 g by mouth daily as needed for mild constipation.   Semaglutide(0.25 or 0.5MG /DOS) 2 MG/1.5ML Sopn Inject 0.5 mg into the skin See admin instructions. Inject 1mL into the skin once weekly every Friday  Discharge Care Instructions  (From admission, onward)           Start     Ordered   11/10/22 0000  If the dressing is still on your incision site when you go home, remove it on the third day after your surgery date. Remove dressing if it begins to fall off, or if it is dirty or damaged before the third day.        11/10/22 1155            Contact information for follow-up providers     Sheryl Blower., MD. Schedule an appointment as soon as possible for a visit in 2 week(s).   Specialty: Internal Medicine Why: for hospital follow-up Contact information: [address] [place] [address] [phone]              Contact information for after-discharge care     Destination      [facility] .   Service: Skilled Nursing Contact information: [address] [place] [address] [phone]                    Discharge Exam: Ceasar Mons Weights   11/06/22 0500 11/09/22 0703 11/10/22 0500  Weight: 103.1 kg 130.2 kg 130.1 kg   S: No acute complaints, tolerating diet, no abdominal pain nausea or vomiting.  Looking forward to discharge today.  BP (!) 141/83 (BP Location: Right Arm)   Pulse 91   Temp 98 F (36.7 C)   Resp 18   Ht 5\' 4"  (1.626 m)   Wt 130.1 kg   SpO2 100%   BMI 49.23 kg/m   Physical Exam General: Alert and oriented x 3, NAD Cardiovascular: S1 S2 clear, RRR.  Respiratory: CTAB, no wheezing Gastrointestinal: Soft, nontender, nondistended, NBS Ext: no pedal edema bilaterally Neuro: no new deficits Psych: Normal affect    Condition at discharge: fair  The results of significant diagnostics from this hospitalization (including imaging, microbiology, ancillary and laboratory) are listed below for reference.   Imaging Studies: CT ABDOMEN PELVIS WO CONTRAST  Result Date: 11/08/2022 CLINICAL DATA:  Abdominal pain, acute, nonlocalized EXAM: CT ABDOMEN AND PELVIS WITHOUT CONTRAST TECHNIQUE: Multidetector CT imaging of the abdomen and pelvis was performed following the standard protocol without IV contrast. RADIATION DOSE REDUCTION: This exam was performed according to the departmental dose-optimization program which includes automated exposure control, adjustment of the mA and/or kV according to patient size and/or use of iterative reconstruction technique. COMPARISON:  07/26/2022 FINDINGS: Lower chest: small right pleural effusion and trace left pleural effusion. Heart is enlarged. Coronary artery and aortic atherosclerosis. Hepatobiliary: Few scattered small hypodensities most compatible with cysts. No suspicious focal hepatic abnormality. Prior cholecystectomy. Common bile duct remains mildly  prominent. Pancreas: No focal abnormality or ductal dilatation. Embolization coils noted in the peripancreatic region, unchanged. Spleen: Spleen is mildly enlarged, 13.3 cm in craniocaudal length. 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 Adrenals/Urinary Tract: No adrenal abnormality. No focal renal abnormality. No stones or hydronephrosis. Urinary bladder is unremarkable. Stomach/Bowel: Sigmoid diverticulosis. No active diverticulitis. Stomach and small bowel decompressed, unremarkable. Vascular/Lymphatic: Aortic atherosclerosis. No evidence of aneurysm or adenopathy. Reproductive: Uterus and adnexa unremarkable.  No mass. Other: No free fluid or free air. Musculoskeletal: No acute bony abnormality. IMPRESSION: Small right pleural effusion and trace left pleural effusion. Bibasilar atelectasis. Mild splenomegaly. 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 Sigmoid diverticulosis.  No active diverticulitis. Coronary artery disease, aortic atherosclerosis. Electronically Signed   By: Charlett Nose M.D.   On: 11/08/2022 21:19   DG Chest Port 1 View  Result Date: 11/05/2022 CLINICAL DATA:  Chest pain. EXAM: PORTABLE CHEST 1 VIEW COMPARISON:  Chest x-ray 10/29/2022. FINDINGS: Patchy opacities in both lung bases and the right upper lung. Enlarged cardiac silhouette. No visible pleural effusions or pneumothorax. IMPRESSION: 1. Mild opacities in both lung bases and the right upper lung, which could represent edema and/or multifocal pneumonia. 2. Cardiomegaly. Electronically Signed   By: Feliberto Harts M.D.   On: 11/05/2022 09:58   DG Chest Port 1 View  Result Date: 10/29/2022 CLINICAL DATA:  Shortness of breath EXAM: PORTABLE CHEST 1 VIEW COMPARISON:  Chest x-ray 10/07/2022 FINDINGS: The heart is enlarged. There central pulmonary vascular congestion. There are interstitial and hazy  opacities throughout the right mid and lower lung and left lower lung. No pleural effusion or pneumothorax. No acute fractures. IMPRESSION: 1. Cardiomegaly with central pulmonary vascular congestion. 2. Interstitial and hazy opacities throughout the right mid and lower lung and left lower lung, which may represent asymmetric edema or infection. Electronically Signed   By: Darliss Cheney M.D.   On: 10/29/2022 23:00    Microbiology: Results for orders placed or performed during the hospital encounter of 10/29/22  Urine Culture     Status: Abnormal   Collection Time: 10/29/22 11:16 PM   Specimen: Urine, Clean Catch  Result Value Ref Range Status   Specimen Description URINE, CLEAN CATCH  Final   Special Requests   Final    NONE Performed at [facility], [address] [place], [place] [address]    Culture >=100,000 COLONIES/mL KLEBSIELLA PNEUMONIAE (A)  Final   Report Status 11/01/2022 FINAL  Final   Organism ID, Bacteria KLEBSIELLA PNEUMONIAE (A)  Final      Susceptibility   Klebsiella pneumoniae - MIC*    AMPICILLIN >=32 RESISTANT Resistant     CEFAZOLIN <=4 SENSITIVE Sensitive     CEFEPIME <=0.12 SENSITIVE Sensitive     CEFTRIAXONE <=0.25 SENSITIVE Sensitive     CIPROFLOXACIN <=0.25 SENSITIVE Sensitive     GENTAMICIN <=1 SENSITIVE Sensitive     IMIPENEM <=0.25 SENSITIVE Sensitive     NITROFURANTOIN 128 RESISTANT Resistant     TRIMETH/SULFA <=20 SENSITIVE Sensitive     AMPICILLIN/SULBACTAM >=32 RESISTANT Resistant     PIP/TAZO 32 INTERMEDIATE Intermediate     * >=100,000 COLONIES/mL KLEBSIELLA PNEUMONIAE    Labs: CBC: Recent Labs  Lab 11/05/22 0034 11/05/22 2334 11/08/22 0329  WBC 3.7* 3.7* 2.7*  HGB 8.9* 8.2* 8.8*  HCT 30.1* 27.9* 30.2*  MCV 102.4* 102.6* 103.8*  PLT 192 189 178   Basic Metabolic Panel: Recent Labs  Lab 11/05/22 0034 11/05/22 2334 11/08/22 0329  NA 138 136 139  K 4.5 4.3 4.9  CL 97* 98 99  CO2 31 33* 34*  GLUCOSE 125* 128* 113*  BUN 32*  28* 22  CREATININE 1.22* 1.19* 1.12*  CALCIUM 8.7* 8.4* 9.1   Liver Function Tests: No results for input(s): "AST", "ALT", "ALKPHOS", "BILITOT", "PROT", "ALBUMIN" in the last 168 hours. CBG: Recent Labs  Lab 11/09/22 0808 11/09/22 1216 11/09/22 1546 11/09/22 2132 11/10/22 0831  GLUCAP 122* 134* 109* 138* 121*    Discharge time spent: greater than 30 minutes.  Signed: Thad Ranger, MD [facility] 11/10/2022

## 2022-11-10 NOTE — Progress Notes (Signed)
IV removed, absent of bleeding and drainage, patient tolerated well and dry gauze dressing in place.  Pt VS stable. Patient was picked up by ambulance and was DC with all of her personal belongings including electronic and eye glasses. Report was already called and given per day shift.   Neeta Storey

## 2022-11-10 NOTE — Progress Notes (Signed)
   11/10/22 1954  BiPAP/CPAP/SIPAP  Reason BIPAP/CPAP not in use Non-compliant

## 2022-11-10 NOTE — TOC Transition Note (Addendum)
Transition of Care [facility]) - CM/SW Discharge Note   Patient Details  Name: Sheryl Aguilar MRN: [id number] Date of Birth: 06/20/1945  Transition of Care [facility]) CM/SW Contact:  Kionna Brier A [place], Theresia Majors Phone Number: 11/10/2022, 3:56 PM   Clinical Narrative:     CSW contacted pt's daughter regarding discharge. She said that she would bring pt's Bipap machine to the facility this evening. CSW provided contact number for [employer] for pt's daughter to assist with getting needed part of equipment.   [facility] will [facility] to: [facility]  Anticipated DC date: 11/10/22  Family notified: Jodelle Green  Transport by: Sharin Mons  Reference ID: [id number]  Auth ID [id number]      Per MD patient ready for DC to [facility] . RN, patient, patient's family, and facility notified of DC. Discharge Summary and FL2 sent to facility. RN to call report prior to discharge [phone], Room 305 ). DC packet on chart. Ambulance transport requested for patient.     CSW will sign off for now as social work intervention is no longer needed. Please consult [place] again if new needs arise.    Final next level of care: Skilled Nursing Facility Barriers to Discharge: Barriers Resolved   Patient Goals and CMS Choice CMS Medicare.gov Compare Post Acute Care list provided to:: Patient Represenative (must comment) Choice offered to / list presented to : Adult Children  Discharge Placement                Patient [facility] at: [facility] Patient to be transferred to facility by: [facility] Name of family member notified: Jodelle Green Patient and family notified of of transfer: 11/10/22  Discharge Plan and Services Additional resources added to the After Visit Summary for   In-house Referral: Clinical Social Work Discharge Planning Services: CM Consult Post Acute Care Choice: Skilled Nursing Facility            DME Agency: [employer] Date DME Agency Contacted: 10/31/22 Time DME Agency Contacted:  1650 Representative spoke with at DME Agency: Vaughan Basta, CM with [employer]            Social Determinants of Health (SDOH) Interventions SDOH Screenings   Food Insecurity: No Food Insecurity (11/01/2022)  Housing: Patient Declined (11/01/2022)  Transportation Needs: No Transportation Needs (10/30/2022)  Utilities: Not At Risk (10/30/2022)  Financial Resource Strain: Medium Risk (03/17/2022)   [facility] to [facility]21[facility] prior to 06/21/2022.  Physical Activity: Inactive (03/17/2022)   [facility] visits prior to 06/21/2022., [facility] visits prior to 06/21/2022.  Social Connections: Socially Isolated (03/17/2022)   [facility] visits prior to 06/21/2022., [facility] visits prior to 06/21/2022.  Stress: No Stress Concern Present (03/17/2022)   [facility] prior to 06/21/2022., [facility] visits prior to 06/21/2022.  Tobacco Use: Low Risk  (10/29/2022)  Recent Concern: Tobacco Use - Medium Risk (10/24/2022)   [facility]     Readmission Risk Interventions    11/05/2022   12:14 PM 10/31/2022    4:52 PM  Readmission Risk Prevention Plan  Transportation Screening Complete Complete  PCP or Specialist Appt within 5-7 Days Complete Complete  Home Care Screening Complete Complete  Medication  Review (RN CM) Complete Complete

## 2022-11-10 NOTE — Progress Notes (Signed)
Called report to [place], LPN at [facility]. Penne Lash, RN

## 2022-11-10 NOTE — Progress Notes (Signed)
Physical Therapy Treatment Patient Details Name: Sheryl Aguilar MRN: [id number] DOB: November 23, 1945 Today's Date: 11/10/2022   History of Present Illness 77 y.o. female admitted 7/10 with SOB, weakness. PMHx: pt D/Cd from [facility] 10/28/22 after 6 week stay for PNA and GIB. Severe morbid obesity, COPD, OSA on CPAP, chronic hypoxia on 3 L, HFrEF, Afib, chronic pain, breast CA    PT Comments  Pt is slowly progressing towards goals. Pt was 2 person Mod A for sit to stand from EOB with heavy cueing for safe transfer sequencing and hand placement. Pt is anxious about mobilizing and prefers to mobilize with the SARA STEDY; she was educated on role of physical therapy in acute setting and agreeable to stand without stedy. Pt was able to stand 2x and perform transfer with 2 person assist with significant difficulty progressing the RLE due to pain with WB on the LLE. Due to pt current functional status, home set up and available assistance at home recommending skilled physical therapy services at a higher level of care and frequency at 5x/weekly in order to improve strength and balance to decrease pain, decrease risk for falls and decrease risk for re-hospitalization. Pt was limited this treatment session by knee pain.     Assistance Recommended at Discharge Frequent or constant Supervision/Assistance  If plan is discharge home, recommend the following:  Can travel by private vehicle    A lot of help with walking and/or transfers;A lot of help with bathing/dressing/bathroom;Assistance with cooking/housework;Direct supervision/assist for medications management;Assist for transportation;Help with stairs or ramp for entrance   No  Equipment Recommendations  None recommended by PT;Other (comment) (defer to post acute)    Recommendations for Other Services       Precautions / Restrictions Precautions Precautions: Fall Restrictions Weight Bearing Restrictions: No     Mobility  Bed Mobility Overal bed  mobility: Needs Assistance Bed Mobility: Sit to Supine     Supine to sit: Supervision     General bed mobility comments: Supervision with minimal use of bed rails and increased time to sit EOB. Patient Response: Cooperative, Anxious  Transfers Overall transfer level: Needs assistance Equipment used: Rolling walker (2 wheels) Transfers: Sit to/from Stand, Bed to chair/wheelchair/BSC Sit to Stand: +2 physical assistance, Mod assist           General transfer comment: Pt rquires Mod A +2 to stand from EOB 2x with verbal cues for safe hand placement and heavy cueing to prevent pt from sitting too early to prevent a fall. Pt requires heavy encouragment to stand without stedy and is limited by pain in the knees.    Ambulation/Gait               General Gait Details: Pt was able to take a few steps toward the recliner from EOB. Difficulty WB on the LLE with very short stance time and difficutly progressing the RLE.     Tilt Bed Tilt Bed Patient Response: Cooperative, Anxious  Modified Rankin (Stroke Patients Only)       Balance Overall balance assessment: Needs assistance Sitting-balance support: No upper extremity supported, Feet supported, Bilateral upper extremity supported Sitting balance-Leahy Scale: Good     Standing balance support: Bilateral upper extremity supported, Reliant on assistive device for balance Standing balance-Leahy Scale: Poor Standing balance comment: Reliance on RW when standing. Requires 2 person assist for dynamic movement for transfer to prevent fall.        Cognition Arousal/Alertness: Awake/alert Behavior During Therapy: WFL for tasks assessed/performed  Overall Cognitive Status: Within Functional Limits for tasks assessed Area of Impairment: Safety/judgement, Awareness         Safety/Judgement: Decreased awareness of safety     General Comments: Pt continues to require encouragement to mobilize           General Comments  General comments (skin integrity, edema, etc.): pt requires encouragement to participate without STEDY with education on the role of physical therapy in the acute setting.      Pertinent Vitals/Pain Pain Assessment Pain Assessment: Faces Faces Pain Scale: Hurts even more Pain Location: LEs with mobility Pain Descriptors / Indicators: Sore, Grimacing Pain Intervention(s): Monitored during session, Limited activity within patient's tolerance     PT Goals (current goals can now be found in the care plan section) Acute Rehab PT Goals Patient Stated Goal: to get back to bed PT Goal Formulation: With patient Time For Goal Achievement: 11/13/22 Potential to Achieve Goals: Good Progress towards PT goals: Progressing toward goals    Frequency    Min 2X/week      PT Plan Current plan remains appropriate       AM-PAC PT "6 Clicks" Mobility   Outcome Measure    Help needed moving from lying on your back to sitting on the side of a flat bed without using bedrails?: A Little Help needed moving to and from a bed to a chair (including a wheelchair)?: Total Help needed standing up from a chair using your arms (e.g., wheelchair or bedside chair)?: Total Help needed to walk in hospital room?: Total Help needed climbing 3-5 steps with a railing? : Total 6 Click Score: 7    End of Session Equipment Utilized During Treatment: Oxygen;Gait belt Activity Tolerance: Patient tolerated treatment well;Patient limited by pain Patient left: with call bell/phone within reach;in chair;with chair alarm set Nurse Communication: Mobility status PT Visit Diagnosis: Other abnormalities of gait and mobility (R26.89);Muscle weakness (generalized) (M62.81)     Time: 6578-4696 PT Time Calculation (min) (ACUTE ONLY): 15 min  Charges:    $Therapeutic Activity: 8-22 mins PT General Charges $$ ACUTE PT VISIT: 1 Visit                     Harrel Carina, DPT, CLT  Acute Rehabilitation  Services Office: [phone] (Secure chat preferred)    Claudia Desanctis 11/10/2022, 1:05 PM

## 2022-11-10 NOTE — Plan of Care (Signed)

## 2022-11-10 NOTE — Plan of Care (Signed)

## 2022-11-10 NOTE — TOC Progression Note (Signed)
Transition of Care [facility]) - Progression Note    Patient Details  Name: Sheryl Aguilar MRN: [id number] Date of Birth: 05-13-1945  [facility] CM/SW Contact  Antara Brecheisen A [place], [place] Phone Number: 11/10/2022, 11:20 AM  Clinical Narrative:     CSW received authorization approval for pt to [facility].   Reference ID: [id number] Auth ID [id number]  Approval dates:  11/07/2022-11/11/2022  CSW will assist with discharge to facility today.  Expected Discharge Plan: Skilled Nursing Facility Barriers to Discharge: Continued Medical Work up  Expected Discharge Plan and Services In-house Referral: Clinical Social Work Discharge Planning Services: CM Consult Post Acute Care Choice: Skilled Nursing Facility Living arrangements for the past 2 months: Apartment                   DME Agency: [employer] Date DME Agency Contacted: 10/31/22 Time DME Agency Contacted: 1650 Representative spoke with at DME Agency: Vaughan Basta, CM with [employer]             Social Determinants of Health (SDOH) Interventions SDOH Screenings   Food Insecurity: No Food Insecurity (11/01/2022)  Housing: Patient Declined (11/01/2022)  Transportation Needs: No Transportation Needs (10/30/2022)  Utilities: Not At Risk (10/30/2022)  Financial Resource Strain: Medium Risk (03/17/2022)   [facility] to [facility] visits prior to 06/21/2022.  Physical Activity: Inactive (03/17/2022)   [facility] prior to 06/21/2022[facility] visits prior to 06/21/2022.  Social Connections: Socially Isolated (03/17/2022)   [facility] prior to 06/21/2022[facility] visits prior to 06/21/2022.  Stress: No Stress Concern Present  (03/17/2022)   [facility] to 06/21/2022., [facility] visits prior to 06/21/2022.  Tobacco Use: Low Risk  (10/29/2022)  Recent Concern: Tobacco Use - Medium Risk (10/24/2022)   [facility]    Readmission Risk Interventions    11/05/2022   12:14 PM 10/31/2022    4:52 PM  Readmission Risk Prevention Plan  Transportation Screening Complete Complete  PCP or Specialist Appt within 5-7 Days Complete Complete  Home Care Screening Complete Complete  Medication Review (RN CM) Complete Complete

## 2023-10-06 ENCOUNTER — Other Ambulatory Visit (HOSPITAL_COMMUNITY): Payer: Self-pay | Admitting: Family Medicine

## 2023-10-06 DIAGNOSIS — I482 Chronic atrial fibrillation, unspecified: Secondary | ICD-10-CM

## 2023-11-18 ENCOUNTER — Telehealth (HOSPITAL_COMMUNITY): Payer: Self-pay | Admitting: Emergency Medicine

## 2023-11-18 NOTE — Telephone Encounter (Signed)
 Attempted to call patient regarding upcoming cardiac CT appointment. Left message on voicemail with name and callback number Rockwell Alexandria RN Navigator Cardiac Imaging [facility] [phone] Office [phone] Cell

## 2023-11-19 ENCOUNTER — Ambulatory Visit (HOSPITAL_COMMUNITY): Admission: RE | Admit: 2023-11-19 | Source: Ambulatory Visit | Attending: Family Medicine | Admitting: Family Medicine

## 2023-12-08 ENCOUNTER — Telehealth (HOSPITAL_COMMUNITY): Payer: Self-pay | Admitting: *Deleted

## 2023-12-08 NOTE — Telephone Encounter (Signed)
 Reaching out to patient to offer assistance regarding upcoming cardiac imaging study; pt's daughter answered phone and  verbalizes understanding of appt date/time, parking situation and where to check in, pre-test NPO status, and verified current allergies; name and call back number provided for further questions should they arise  Chantal Requena RN Navigator Cardiac Imaging [facility] [phone] office [phone] cell

## 2023-12-09 ENCOUNTER — Ambulatory Visit (HOSPITAL_COMMUNITY)
Admission: RE | Admit: 2023-12-09 | Discharge: 2023-12-09 | Disposition: A | Discharge status: Home / Self Care | Source: Ambulatory Visit | Attending: Family Medicine | Admitting: Family Medicine

## 2023-12-09 DIAGNOSIS — I2510 Atherosclerotic heart disease of native coronary artery without angina pectoris: Secondary | ICD-10-CM

## 2023-12-09 DIAGNOSIS — I482 Chronic atrial fibrillation, unspecified: Secondary | ICD-10-CM | POA: Insufficient documentation

## 2023-12-09 LAB — POCT I-STAT CREATININE: Creatinine, Ser: 1.5 mg/dL — ABNORMAL HIGH (ref 0.44–1.00)

## 2023-12-09 MED ORDER — IOHEXOL 350 MG/ML SOLN
100.0000 mL | Freq: Once | INTRAVENOUS | Status: AC | PRN
  Administered 2023-12-09: 100 mL via INTRAVENOUS

## 2023-12-09 MED ORDER — NITROGLYCERIN 0.4 MG SL SUBL
0.8000 mg | SUBLINGUAL_TABLET | Freq: Once | SUBLINGUAL | Status: AC
  Administered 2023-12-09: 0.8 mg via SUBLINGUAL

## 2023-12-10 ENCOUNTER — Other Ambulatory Visit: Payer: Self-pay | Admitting: Cardiology

## 2023-12-10 ENCOUNTER — Ambulatory Visit (HOSPITAL_COMMUNITY)
Admission: RE | Admit: 2023-12-10 | Discharge: 2023-12-10 | Disposition: A | Discharge status: Home / Self Care | Source: Ambulatory Visit | Attending: Cardiology | Admitting: Cardiology

## 2023-12-10 DIAGNOSIS — R931 Abnormal findings on diagnostic imaging of heart and coronary circulation: Secondary | ICD-10-CM
